# Patient Record
Sex: Female | Born: 1940 | Race: White | Hispanic: No | Smoking: Never smoker
Health system: Southern US, Community
[De-identification: ages and names within clinical notes are randomized; demographics above are authoritative.]

## PROBLEM LIST (undated history)

## (undated) DIAGNOSIS — D709 Neutropenia, unspecified: Secondary | ICD-10-CM

## (undated) DIAGNOSIS — F419 Anxiety disorder, unspecified: Secondary | ICD-10-CM

## (undated) DIAGNOSIS — M1990 Unspecified osteoarthritis, unspecified site: Secondary | ICD-10-CM

## (undated) DIAGNOSIS — G4730 Sleep apnea, unspecified: Secondary | ICD-10-CM

## (undated) DIAGNOSIS — E039 Hypothyroidism, unspecified: Secondary | ICD-10-CM

## (undated) HISTORY — PX: ABDOMINAL HYSTERECTOMY: SHX81

## (undated) HISTORY — PX: APPENDECTOMY: SHX54

## (undated) HISTORY — PX: BREAST SURGERY: SHX581

## (undated) HISTORY — PX: TONSILLECTOMY: SUR1361

## (undated) HISTORY — PX: CHOLECYSTECTOMY: SHX55

---
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
## 2017-08-19 ENCOUNTER — Encounter (INDEPENDENT_AMBULATORY_CARE_PROVIDER_SITE_OTHER): Payer: Self-pay | Admitting: Orthopaedic Surgery

## 2017-08-19 ENCOUNTER — Ambulatory Visit (INDEPENDENT_AMBULATORY_CARE_PROVIDER_SITE_OTHER): Payer: Medicare Other

## 2017-08-19 ENCOUNTER — Ambulatory Visit (INDEPENDENT_AMBULATORY_CARE_PROVIDER_SITE_OTHER): Payer: Medicare Other | Admitting: Orthopaedic Surgery

## 2017-08-19 ENCOUNTER — Ambulatory Visit (INDEPENDENT_AMBULATORY_CARE_PROVIDER_SITE_OTHER): Payer: Self-pay

## 2017-08-19 DIAGNOSIS — M25562 Pain in left knee: Secondary | ICD-10-CM | POA: Diagnosis not present

## 2017-08-19 DIAGNOSIS — M1712 Unilateral primary osteoarthritis, left knee: Secondary | ICD-10-CM | POA: Diagnosis not present

## 2017-08-19 DIAGNOSIS — M25561 Pain in right knee: Secondary | ICD-10-CM | POA: Diagnosis not present

## 2017-08-19 DIAGNOSIS — M1711 Unilateral primary osteoarthritis, right knee: Secondary | ICD-10-CM | POA: Diagnosis not present

## 2017-08-19 NOTE — Progress Notes (Signed)
Office Visit Note   Patient: Bonnie [name]           Date of Birth: Aug 22, 1940           MRN: [id number] Visit Date: 08/19/2017              Requested by: No referring provider defined for this encounter. PCP: Jamal CollinHedgecock, Suzanne, PA-C   Assessment & Plan: Visit Diagnoses:  1. Acute pain of right knee   2. Acute pain of left knee   3. Unilateral primary osteoarthritis, left knee   4. Unilateral primary osteoarthritis, right knee     Plan: At this point she has tried and failed all forms conservative treatment.  His pain is been worsening for several years now.  She has had multiple steroid injections in her knees.  She would like to proceed with a knee replacement surgery on the right knee in the near future and I agree with this.  Since she is going on a trip soon though I would like to place steroid injections in both knees because this is something that she would like to have to go on a mission trip.  I showed her knee replacement model and went over x-rays in detail.  I explained in detail the risks and benefits of surgery.  We talked about her intraoperative and postoperative course.  She tolerated steroid injections in both knees well today.  We will work on scheduling her for a right total knee arthroplasty sometime in late August early September.  All question concerns were answered and addressed.  Again I feel this is appropriate now based on her clinical exam combined with the failure of conservative treatment for well over a year and combined with severity of arthritis on x-rays.  Follow-Up Instructions: Return for 2 weeks post-op.   Orders:  Orders Placed This Encounter  Procedures  . XR KNEE 3 VIEW RIGHT  . XR KNEE 3 VIEW LEFT   No orders of the defined types were placed in this encounter.     Procedures: No procedures performed   Clinical Data: No additional findings.   Subjective: Chief Complaint  Patient presents with  . Right Knee - Pain  The patient  comes in today to establish relationship with me as an orthopedic surgeon because her orthopedic surgeon that she is seen for a long period time is retiring.  She has known severe arthritis in both her knees and her right hurts worse than the left.  She has had multiple interventions for her knees with the last injections being about 3 months ago.  At this point she is tried and failed all forms conservative treatment and wants to be considered for knee replacement surgery.  She is someone who is moderately obese and weighs about 215 pounds she states.  She is not a diabetic and otherwise a healthy individual.  She does not smoke.  She is very active.  She is gotten to where the injections and that helped at all.  They are working on quad strengthening exercises as well.  She is still working on weight loss as well.  She does feel it is time to proceed with knee replacement surgery due to the fact that she is tried conservative treatment for several years now and now is starting to detrimental effect directives daily living, her quality of life, mobility.  Her pain is 10 out of 10 on a daily basis. HPI  Review of Systems She currently denies any headache, chest  pain, shortness of breath, fever, chills, nausea, vomiting.  Objective: Vital Signs: There were no vitals taken for this visit.  Physical Exam She is alert and oriented x3 and in no acute distress Ortho Exam Examination both knees show varus malalignment.  Both knees have significant medial joint line tenderness with patellofemoral crepitation as well.  Both knees have full range of motion and feels ligamentously stable. Specialty Comments:  No specialty comments available.  Imaging: Xr Knee 3 View Left  Result Date: 08/19/2017 X-rays of the left knee show severe end-stage arthritis with varus malalignment.  There is almost complete loss of medial joint space with severe patellofemoral disease.  There are periarticular osteophytes  throughout the knee.  Xr Knee 3 View Right  Result Date: 08/19/2017 2 views of the right knee show end-stage arthritic changes.  There is varus malalignment and complete loss of medial joint space.  There is severe patellofemoral arthritic changes.  There are periarticular osteophytes throughout the knee.    PMFS History: Patient Active Problem List   Diagnosis Date Noted  . Unilateral primary osteoarthritis, right knee 08/19/2017   History reviewed. No pertinent past medical history.  History reviewed. No pertinent family history.  History reviewed. No pertinent surgical history. Social History   Occupational History  . Not on file  Tobacco Use  . Smoking status: Never Smoker  . Smokeless tobacco: Never Used  Substance and Sexual Activity  . Alcohol use: Not on file  . Drug use: Not on file  . Sexual activity: Not on file

## 2017-09-01 ENCOUNTER — Other Ambulatory Visit (INDEPENDENT_AMBULATORY_CARE_PROVIDER_SITE_OTHER): Payer: Self-pay

## 2017-09-08 ENCOUNTER — Telehealth (INDEPENDENT_AMBULATORY_CARE_PROVIDER_SITE_OTHER): Payer: Self-pay | Admitting: Orthopaedic Surgery

## 2017-09-08 NOTE — Telephone Encounter (Signed)
Bonnie [name]

## 2017-09-08 NOTE — Telephone Encounter (Signed)
Patient called advised she lost her last week and will have to go to a rehab facility after surgery. Patient said she [facility] to [facility] [place]. The number to contact patient is [phone]

## 2017-09-09 NOTE — Telephone Encounter (Signed)
I called patient and discussed.

## 2017-09-16 ENCOUNTER — Other Ambulatory Visit (INDEPENDENT_AMBULATORY_CARE_PROVIDER_SITE_OTHER): Payer: Self-pay | Admitting: Physician Assistant

## 2017-09-18 NOTE — Pre-Procedure Instructions (Addendum)
Bonnie MaidensGlendene [name]  09/18/2017      [id number] - [place], [place] - [id number] [place] [id number] Phone: [phone] Fax: [phone]    Your procedure is scheduled on September 30, 2017.  [facility] Admitting at 120 PM.  Call this number if you have problems the morning of surgery:  [phone]   Remember:  Do not eat or drink after midnight.      Take these medicines the morning of surgery with A SIP OF WATER  Alprazolam (xanax) Levothyroxine (synthroid)   7 days prior to surgery STOP taking any Aspirin (unless otherwise instructed by your surgeon), Aleve, Naproxen, Ibuprofen, Motrin, Advil, Goody's, BC's, all herbal medications, fish oil, and all vitamins   Do not wear jewelry, make-up or nail polish.  Do not wear lotions, powders, or perfumes, or deodorant.  Do not shave 48 hours prior to surgery.    Do not bring valuables to the hospital.  [facility] is not responsible for any belongings or valuables.  Contacts, dentures or bridgework may not be worn into surgery.  Leave your suitcase in the car.  After surgery it may be brought to your room.  For patients admitted to the hospital, discharge time will be determined by your treatment team.  Patients discharged the day of surgery will not be allowed to drive home.    [facility]- Preparing For Surgery  Before surgery, you can play an important role. Because skin is not sterile, your skin needs to be as free of germs as possible. You can reduce the number of germs on your skin by washing with CHG (chlorahexidine gluconate) Soap before surgery.  CHG is an antiseptic cleaner which kills germs and bonds with the skin to continue killing germs even after washing.    Oral Hygiene is also important to reduce your risk of infection.  Remember - BRUSH YOUR TEETH THE MORNING OF SURGERY WITH YOUR REGULAR TOOTHPASTE  Please do not use if you have an allergy to CHG or  antibacterial soaps. If your skin becomes reddened/irritated stop using the CHG.  Do not shave (including legs and underarms) for at least 48 hours prior to first CHG shower. It is OK to shave your face.  Please follow these instructions carefully.   1. Shower the NIGHT BEFORE SURGERY and the MORNING OF SURGERY with CHG.   2. If you chose to wash your hair, wash your hair first as usual with your normal shampoo.  3. After you shampoo, rinse your hair and body thoroughly to remove the shampoo.  4. Use CHG as you would any other liquid soap. You can apply CHG directly to the skin and wash gently with a scrungie or a clean washcloth.   5. Apply the CHG Soap to your body ONLY FROM THE NECK DOWN.  Do not use on open wounds or open sores. Avoid contact with your eyes, ears, mouth and genitals (private parts). Wash Face and genitals (private parts)  with your normal soap.  6. Wash thoroughly, paying special attention to the area where your surgery will be performed.  7. Thoroughly rinse your body with warm water from the neck down.  8. DO NOT shower/wash with your normal soap after using and rinsing off the CHG Soap.  9. Pat yourself dry with a CLEAN TOWEL.  10. Wear CLEAN PAJAMAS to bed the night before surgery, wear comfortable clothes the morning of surgery  11. Place  CLEAN SHEETS on your bed the night of your first shower and DO NOT SLEEP WITH PETS.   Day of Surgery:  Do not apply any deodorants/lotions.  Please wear clean clothes to the hospital/surgery center.   Remember to brush your teeth WITH YOUR REGULAR TOOTHPASTE.   Please read over the following fact sheets that you were given.

## 2017-09-19 ENCOUNTER — Encounter (HOSPITAL_COMMUNITY)
Admission: RE | Admit: 2017-09-19 | Discharge: 2017-09-19 | Disposition: A | Discharge status: Home / Self Care | Payer: Medicare Other | Source: Ambulatory Visit | Attending: Orthopaedic Surgery | Admitting: Orthopaedic Surgery

## 2017-09-19 ENCOUNTER — Other Ambulatory Visit: Payer: Self-pay

## 2017-09-19 ENCOUNTER — Encounter (HOSPITAL_COMMUNITY): Payer: Self-pay

## 2017-09-19 DIAGNOSIS — M1711 Unilateral primary osteoarthritis, right knee: Secondary | ICD-10-CM | POA: Insufficient documentation

## 2017-09-19 DIAGNOSIS — Z01818 Encounter for other preprocedural examination: Secondary | ICD-10-CM | POA: Diagnosis present

## 2017-09-19 HISTORY — DX: Sleep apnea, unspecified: G47.30

## 2017-09-19 HISTORY — DX: Neutropenia, unspecified: D70.9

## 2017-09-19 HISTORY — DX: Hypothyroidism, unspecified: E03.9

## 2017-09-19 HISTORY — DX: Anxiety disorder, unspecified: F41.9

## 2017-09-19 HISTORY — DX: Unspecified osteoarthritis, unspecified site: M19.90

## 2017-09-19 LAB — BASIC METABOLIC PANEL
Anion gap: 8 (ref 5–15)
BUN: 18 mg/dL (ref 8–23)
CO2: 24 mmol/L (ref 22–32)
CREATININE: 0.77 mg/dL (ref 0.44–1.00)
Calcium: 9.4 mg/dL (ref 8.9–10.3)
Chloride: 110 mmol/L (ref 98–111)
GFR calc Af Amer: >60 mL/min (ref >60)
Glucose, Bld: 99 mg/dL (ref 70–99)
Potassium: 4.9 mmol/L (ref 3.5–5.1)
SODIUM: 142 mmol/L (ref 135–145)

## 2017-09-19 LAB — CBC
HCT: 42.5 % (ref 36.0–46.0)
Hemoglobin: 13.3 g/dL (ref 12.0–15.0)
MCH: 28.9 pg (ref 26.0–34.0)
MCHC: 31.3 g/dL (ref 30.0–36.0)
MCV: 92.4 fL (ref 78.0–100.0)
PLATELETS: 170 10*3/uL (ref 150–400)
RBC: 4.6 MIL/uL (ref 3.87–5.11)
RDW: 13.4 % (ref 11.5–15.5)
WBC: 3.5 10*3/uL — AB (ref 4.0–10.5)

## 2017-09-19 LAB — SURGICAL PCR SCREEN
MRSA, PCR: NEGATIVE
Staphylococcus aureus: NEGATIVE

## 2017-09-19 NOTE — Progress Notes (Addendum)
PCP: Jamal CollinSuzanne Hedgecock, PA-C  Cardiologist:  Pt denies  EKG: pt denies past year  Stress test: pt denies  ECHO: pt denies  Cardiac Cath: pt denies  Chest x-ray: pt denies past year, no recent respiratory infections/complications

## 2017-09-29 MED ORDER — TRANEXAMIC ACID 1000 MG/10ML IV SOLN
1000.0000 mg | INTRAVENOUS | Status: AC
  Administered 2017-09-30: 1000 mg via INTRAVENOUS
  Filled 2017-09-29: qty 1000

## 2017-09-29 MED ORDER — CEFAZOLIN SODIUM-DEXTROSE 2-4 GM/100ML-% IV SOLN
2.0000 g | INTRAVENOUS | Status: AC
  Administered 2017-09-30: 2 g via INTRAVENOUS
  Filled 2017-09-29: qty 100

## 2017-09-30 ENCOUNTER — Inpatient Hospital Stay (HOSPITAL_COMMUNITY)
Admission: RE | Admit: 2017-09-30 | Discharge: 2017-10-03 | DRG: 470 | Disposition: A | Discharge status: Skilled Nursing Facility | Payer: Medicare Other | Source: Ambulatory Visit | Attending: Orthopaedic Surgery | Admitting: Orthopaedic Surgery

## 2017-09-30 ENCOUNTER — Ambulatory Visit (HOSPITAL_COMMUNITY): Payer: Medicare Other | Admitting: Anesthesiology

## 2017-09-30 ENCOUNTER — Encounter (HOSPITAL_COMMUNITY)
Admission: RE | Disposition: A | Discharge status: Skilled Nursing Facility | Payer: Self-pay | Source: Ambulatory Visit | Attending: Orthopaedic Surgery

## 2017-09-30 ENCOUNTER — Encounter (HOSPITAL_COMMUNITY): Payer: Self-pay | Admitting: *Deleted

## 2017-09-30 ENCOUNTER — Inpatient Hospital Stay (HOSPITAL_COMMUNITY): Payer: Medicare Other

## 2017-09-30 DIAGNOSIS — Z90710 Acquired absence of both cervix and uterus: Secondary | ICD-10-CM

## 2017-09-30 DIAGNOSIS — E039 Hypothyroidism, unspecified: Secondary | ICD-10-CM | POA: Diagnosis present

## 2017-09-30 DIAGNOSIS — Z791 Long term (current) use of non-steroidal anti-inflammatories (NSAID): Secondary | ICD-10-CM

## 2017-09-30 DIAGNOSIS — Z888 Allergy status to other drugs, medicaments and biological substances status: Secondary | ICD-10-CM | POA: Diagnosis not present

## 2017-09-30 DIAGNOSIS — Z9049 Acquired absence of other specified parts of digestive tract: Secondary | ICD-10-CM | POA: Diagnosis not present

## 2017-09-30 DIAGNOSIS — Z96651 Presence of right artificial knee joint: Secondary | ICD-10-CM

## 2017-09-30 DIAGNOSIS — Z79890 Hormone replacement therapy (postmenopausal): Secondary | ICD-10-CM | POA: Diagnosis not present

## 2017-09-30 DIAGNOSIS — M1711 Unilateral primary osteoarthritis, right knee: Principal | ICD-10-CM | POA: Diagnosis present

## 2017-09-30 DIAGNOSIS — F419 Anxiety disorder, unspecified: Secondary | ICD-10-CM | POA: Diagnosis present

## 2017-09-30 DIAGNOSIS — G4730 Sleep apnea, unspecified: Secondary | ICD-10-CM | POA: Diagnosis present

## 2017-09-30 DIAGNOSIS — M25561 Pain in right knee: Secondary | ICD-10-CM | POA: Diagnosis present

## 2017-09-30 HISTORY — PX: TOTAL KNEE ARTHROPLASTY: SHX125

## 2017-09-30 SURGERY — ARTHROPLASTY, KNEE, TOTAL
Anesthesia: Monitor Anesthesia Care | Site: Knee | Laterality: Right

## 2017-09-30 MED ORDER — PROPOFOL 10 MG/ML IV BOLUS
INTRAVENOUS | Status: DC | PRN
  Administered 2017-09-30: 20 mg via INTRAVENOUS

## 2017-09-30 MED ORDER — MIDAZOLAM HCL 2 MG/2ML IJ SOLN
INTRAMUSCULAR | Status: AC
  Filled 2017-09-30: qty 2

## 2017-09-30 MED ORDER — ONDANSETRON HCL 4 MG/2ML IJ SOLN
INTRAMUSCULAR | Status: AC
  Filled 2017-09-30: qty 6

## 2017-09-30 MED ORDER — METHOCARBAMOL 1000 MG/10ML IJ SOLN
500.0000 mg | Freq: Four times a day (QID) | INTRAVENOUS | Status: DC | PRN
  Filled 2017-09-30: qty 5

## 2017-09-30 MED ORDER — ASPIRIN EC 325 MG PO TBEC
325.0000 mg | DELAYED_RELEASE_TABLET | Freq: Two times a day (BID) | ORAL | Status: DC
  Administered 2017-10-01 – 2017-10-03 (×5): 325 mg via ORAL
  Filled 2017-09-30 (×5): qty 1

## 2017-09-30 MED ORDER — METOCLOPRAMIDE HCL 5 MG PO TABS: 5.0000 mg | ORAL_TABLET | Freq: Three times a day (TID) | ORAL | Status: DC | PRN

## 2017-09-30 MED ORDER — DOCUSATE SODIUM 100 MG PO CAPS
100.0000 mg | ORAL_CAPSULE | Freq: Two times a day (BID) | ORAL | Status: DC
  Administered 2017-09-30 – 2017-10-03 (×6): 100 mg via ORAL
  Filled 2017-09-30 (×6): qty 1

## 2017-09-30 MED ORDER — DEXAMETHASONE SODIUM PHOSPHATE 10 MG/ML IJ SOLN
INTRAMUSCULAR | Status: AC
  Filled 2017-09-30: qty 2

## 2017-09-30 MED ORDER — METHOCARBAMOL 500 MG PO TABS
500.0000 mg | ORAL_TABLET | Freq: Four times a day (QID) | ORAL | Status: DC | PRN
  Administered 2017-09-30 – 2017-10-03 (×6): 500 mg via ORAL
  Filled 2017-09-30 (×7): qty 1

## 2017-09-30 MED ORDER — POLYETHYLENE GLYCOL 3350 17 G PO PACK
17.0000 g | PACK | Freq: Every day | ORAL | Status: DC | PRN
  Administered 2017-10-02: 17 g via ORAL
  Filled 2017-09-30: qty 1

## 2017-09-30 MED ORDER — ACETAMINOPHEN 325 MG PO TABS: 325.0000 mg | ORAL_TABLET | Freq: Four times a day (QID) | ORAL | Status: DC | PRN

## 2017-09-30 MED ORDER — SODIUM CHLORIDE 0.9 % IV SOLN
INTRAVENOUS | Status: DC | PRN
  Administered 2017-09-30: 30 ug/min via INTRAVENOUS

## 2017-09-30 MED ORDER — BUPIVACAINE IN DEXTROSE 0.75-8.25 % IT SOLN
INTRATHECAL | Status: DC | PRN
  Administered 2017-09-30: 2 mL via INTRATHECAL

## 2017-09-30 MED ORDER — MENTHOL 3 MG MT LOZG: 1.0000 | LOZENGE | OROMUCOSAL | Status: DC | PRN

## 2017-09-30 MED ORDER — ALUM & MAG HYDROXIDE-SIMETH 200-200-20 MG/5ML PO SUSP: 30.0000 mL | ORAL | Status: DC | PRN

## 2017-09-30 MED ORDER — ONDANSETRON HCL 4 MG/2ML IJ SOLN: 4.0000 mg | Freq: Once | INTRAMUSCULAR | Status: DC | PRN

## 2017-09-30 MED ORDER — OXYCODONE HCL 5 MG PO TABS: 10.0000 mg | ORAL_TABLET | ORAL | Status: DC | PRN

## 2017-09-30 MED ORDER — ONDANSETRON HCL 4 MG PO TABS: 4.0000 mg | ORAL_TABLET | Freq: Four times a day (QID) | ORAL | Status: DC | PRN

## 2017-09-30 MED ORDER — FENTANYL CITRATE (PF) 250 MCG/5ML IJ SOLN
INTRAMUSCULAR | Status: AC
  Filled 2017-09-30: qty 5

## 2017-09-30 MED ORDER — HYDROMORPHONE HCL 1 MG/ML IJ SOLN
0.5000 mg | INTRAMUSCULAR | Status: DC | PRN
  Filled 2017-09-30: qty 1

## 2017-09-30 MED ORDER — CHLORHEXIDINE GLUCONATE 4 % EX LIQD: 60.0000 mL | Freq: Once | CUTANEOUS | Status: DC

## 2017-09-30 MED ORDER — MEPERIDINE HCL 50 MG/ML IJ SOLN: 6.2500 mg | INTRAMUSCULAR | Status: DC | PRN

## 2017-09-30 MED ORDER — OXYCODONE HCL 5 MG PO TABS
5.0000 mg | ORAL_TABLET | ORAL | Status: DC | PRN
  Administered 2017-09-30 – 2017-10-01 (×4): 5 mg via ORAL
  Administered 2017-10-01: 10 mg via ORAL
  Administered 2017-10-01 – 2017-10-03 (×6): 5 mg via ORAL
  Filled 2017-09-30 (×5): qty 1
  Filled 2017-09-30: qty 2
  Filled 2017-09-30 (×5): qty 1

## 2017-09-30 MED ORDER — ALPRAZOLAM 0.25 MG PO TABS: 0.2500 mg | ORAL_TABLET | Freq: Every day | ORAL | Status: DC | PRN

## 2017-09-30 MED ORDER — LEVOTHYROXINE SODIUM 75 MCG PO TABS
150.0000 ug | ORAL_TABLET | Freq: Every day | ORAL | Status: DC
  Administered 2017-10-01 – 2017-10-03 (×3): 150 ug via ORAL
  Filled 2017-09-30 (×3): qty 2

## 2017-09-30 MED ORDER — FENTANYL CITRATE (PF) 100 MCG/2ML IJ SOLN
INTRAMUSCULAR | Status: AC
  Filled 2017-09-30: qty 2

## 2017-09-30 MED ORDER — PANTOPRAZOLE SODIUM 40 MG PO TBEC
40.0000 mg | DELAYED_RELEASE_TABLET | Freq: Every day | ORAL | Status: DC
  Administered 2017-10-01 – 2017-10-03 (×3): 40 mg via ORAL
  Filled 2017-09-30 (×3): qty 1

## 2017-09-30 MED ORDER — LACTATED RINGERS IV SOLN
INTRAVENOUS | Status: DC
  Administered 2017-09-30: 13:00:00 via INTRAVENOUS

## 2017-09-30 MED ORDER — PHENOL 1.4 % MT LIQD: 1.0000 | OROMUCOSAL | Status: DC | PRN

## 2017-09-30 MED ORDER — HYDROMORPHONE HCL 1 MG/ML IJ SOLN: 0.2500 mg | INTRAMUSCULAR | Status: DC | PRN

## 2017-09-30 MED ORDER — SODIUM CHLORIDE 0.9 % IR SOLN
Status: DC | PRN
  Administered 2017-09-30: 3000 mL

## 2017-09-30 MED ORDER — PROPOFOL 500 MG/50ML IV EMUL
INTRAVENOUS | Status: DC | PRN
  Administered 2017-09-30: 50 ug/kg/min via INTRAVENOUS

## 2017-09-30 MED ORDER — 0.9 % SODIUM CHLORIDE (POUR BTL) OPTIME
TOPICAL | Status: DC | PRN
  Administered 2017-09-30: 1000 mL

## 2017-09-30 MED ORDER — ONDANSETRON HCL 4 MG/2ML IJ SOLN
INTRAMUSCULAR | Status: DC | PRN
  Administered 2017-09-30: 4 mg via INTRAVENOUS

## 2017-09-30 MED ORDER — CEFAZOLIN SODIUM-DEXTROSE 1-4 GM/50ML-% IV SOLN
1.0000 g | Freq: Four times a day (QID) | INTRAVENOUS | Status: AC
  Administered 2017-09-30 – 2017-10-01 (×2): 1 g via INTRAVENOUS
  Filled 2017-09-30 (×2): qty 50

## 2017-09-30 MED ORDER — SODIUM CHLORIDE 0.9 % IV SOLN
INTRAVENOUS | Status: DC
  Administered 2017-09-30: 19:00:00 via INTRAVENOUS

## 2017-09-30 MED ORDER — METOCLOPRAMIDE HCL 5 MG/ML IJ SOLN: 5.0000 mg | Freq: Three times a day (TID) | INTRAMUSCULAR | Status: DC | PRN

## 2017-09-30 MED ORDER — LACTATED RINGERS IV SOLN
INTRAVENOUS | Status: DC | PRN
  Administered 2017-09-30: 13:00:00 via INTRAVENOUS

## 2017-09-30 MED ORDER — PHENYLEPHRINE HCL 10 MG/ML IJ SOLN
INTRAMUSCULAR | Status: AC
  Filled 2017-09-30: qty 1

## 2017-09-30 MED ORDER — FENTANYL CITRATE (PF) 100 MCG/2ML IJ SOLN
INTRAMUSCULAR | Status: DC | PRN
  Administered 2017-09-30: 50 ug via INTRAVENOUS

## 2017-09-30 MED ORDER — MIDAZOLAM HCL 5 MG/5ML IJ SOLN
INTRAMUSCULAR | Status: DC | PRN
  Administered 2017-09-30: 2 mg via INTRAVENOUS

## 2017-09-30 MED ORDER — DIPHENHYDRAMINE HCL 12.5 MG/5ML PO ELIX: 12.5000 mg | ORAL_SOLUTION | ORAL | Status: DC | PRN

## 2017-09-30 MED ORDER — ONDANSETRON HCL 4 MG/2ML IJ SOLN: 4.0000 mg | Freq: Four times a day (QID) | INTRAMUSCULAR | Status: DC | PRN

## 2017-09-30 SURGICAL SUPPLY — 66 items
BANDAGE ACE 6X5 VEL STRL LF (GAUZE/BANDAGES/DRESSINGS) ×4 IMPLANT
BANDAGE ESMARK 6X9 LF (GAUZE/BANDAGES/DRESSINGS) ×1 IMPLANT
BEARIN INSERT TIBIAL 3X13 (Insert) ×2 IMPLANT
BEARING INSERT TIBIAL 3X13 (Insert) ×1 IMPLANT
BLADE SAG 18X100X1.27 (BLADE) ×2 IMPLANT
BNDG ESMARK 6X9 LF (GAUZE/BANDAGES/DRESSINGS) ×2
BOWL SMART MIX CTS (DISPOSABLE) ×2 IMPLANT
COVER SURGICAL LIGHT HANDLE (MISCELLANEOUS) ×2 IMPLANT
CUFF TOURNIQUET SINGLE 34IN LL (TOURNIQUET CUFF) ×2 IMPLANT
CUFF TOURNIQUET SINGLE 44IN (TOURNIQUET CUFF) IMPLANT
DRAPE EXTREMITY T 121X128X90 (DRAPE) ×2 IMPLANT
DRAPE HALF SHEET 40X57 (DRAPES) ×2 IMPLANT
DRAPE U-SHAPE 47X51 STRL (DRAPES) ×2 IMPLANT
DRSG PAD ABDOMINAL 8X10 ST (GAUZE/BANDAGES/DRESSINGS) ×4 IMPLANT
DURAPREP 26ML APPLICATOR (WOUND CARE) ×2 IMPLANT
ELECT CAUTERY BLADE 6.4 (BLADE) ×2 IMPLANT
ELECT REM PT RETURN 9FT ADLT (ELECTROSURGICAL) ×2
ELECTRODE REM PT RTRN 9FT ADLT (ELECTROSURGICAL) ×1 IMPLANT
FACESHIELD WRAPAROUND (MASK) ×4 IMPLANT
FEMORAL POSTERIOR SZ3 RT (Joint) ×1 IMPLANT
GAUZE SPONGE 4X4 12PLY STRL (GAUZE/BANDAGES/DRESSINGS) ×2 IMPLANT
GAUZE SPONGE 4X4 12PLY STRL LF (GAUZE/BANDAGES/DRESSINGS) ×2 IMPLANT
GAUZE XEROFORM 1X8 LF (GAUZE/BANDAGES/DRESSINGS) ×2 IMPLANT
GAUZE XEROFORM 5X9 LF (GAUZE/BANDAGES/DRESSINGS) ×2 IMPLANT
GLOVE BIOGEL PI IND STRL 8 (GLOVE) ×2 IMPLANT
GLOVE BIOGEL PI INDICATOR 8 (GLOVE) ×2
GLOVE ORTHO TXT STRL SZ7.5 (GLOVE) ×2 IMPLANT
GLOVE SURG ORTHO 8.0 STRL STRW (GLOVE) ×2 IMPLANT
GOWN STRL REUS W/ TWL LRG LVL3 (GOWN DISPOSABLE) IMPLANT
GOWN STRL REUS W/ TWL XL LVL3 (GOWN DISPOSABLE) ×2 IMPLANT
GOWN STRL REUS W/TWL LRG LVL3 (GOWN DISPOSABLE)
GOWN STRL REUS W/TWL XL LVL3 (GOWN DISPOSABLE) ×2
HANDPIECE INTERPULSE COAX TIP (DISPOSABLE) ×1
IMMOBILIZER KNEE 22 (SOFTGOODS) ×2 IMPLANT
IMMOBILIZER KNEE 22 UNIV (SOFTGOODS) ×2 IMPLANT
KIT BASIN OR (CUSTOM PROCEDURE TRAY) ×2 IMPLANT
KIT TURNOVER KIT B (KITS) ×2 IMPLANT
KNEE PATELLA ASYMMETRIC 9X29 (Knees) ×2 IMPLANT
KNEE TIBIAL COMPONENT SZ3 (Knees) ×2 IMPLANT
MANIFOLD NEPTUNE II (INSTRUMENTS) ×2 IMPLANT
NDL SAFETY ECLIPSE 18X1.5 (NEEDLE) IMPLANT
NEEDLE HYPO 18GX1.5 SHARP (NEEDLE)
NS IRRIG 1000ML POUR BTL (IV SOLUTION) ×2 IMPLANT
PACK TOTAL JOINT (CUSTOM PROCEDURE TRAY) ×2 IMPLANT
PAD ABD 8X10 STRL (GAUZE/BANDAGES/DRESSINGS) ×2 IMPLANT
PAD ARMBOARD 7.5X6 YLW CONV (MISCELLANEOUS) ×2 IMPLANT
PADDING CAST COTTON 6X4 STRL (CAST SUPPLIES) ×2 IMPLANT
POSTERIOR FEMORAL SZ3 RT (Joint) ×2 IMPLANT
SET HNDPC FAN SPRY TIP SCT (DISPOSABLE) ×1 IMPLANT
SET PAD KNEE POSITIONER (MISCELLANEOUS) ×2 IMPLANT
STAPLER VISISTAT 35W (STAPLE) IMPLANT
STRIP CLOSURE SKIN 1/2X4 (GAUZE/BANDAGES/DRESSINGS) IMPLANT
SUCTION FRAZIER HANDLE 10FR (MISCELLANEOUS) ×1
SUCTION TUBE FRAZIER 10FR DISP (MISCELLANEOUS) ×1 IMPLANT
SUT MNCRL AB 4-0 PS2 18 (SUTURE) IMPLANT
SUT VIC AB 0 CT1 27 (SUTURE) ×1
SUT VIC AB 0 CT1 27XBRD ANBCTR (SUTURE) ×1 IMPLANT
SUT VIC AB 1 CT1 27 (SUTURE) ×2
SUT VIC AB 1 CT1 27XBRD ANBCTR (SUTURE) ×2 IMPLANT
SUT VIC AB 2-0 CT1 27 (SUTURE) ×2
SUT VIC AB 2-0 CT1 TAPERPNT 27 (SUTURE) ×2 IMPLANT
SYR 50ML LL SCALE MARK (SYRINGE) IMPLANT
TOWEL OR 17X24 6PK STRL BLUE (TOWEL DISPOSABLE) ×2 IMPLANT
TOWEL OR 17X26 10 PK STRL BLUE (TOWEL DISPOSABLE) ×2 IMPLANT
TRAY CATH 16FR W/PLASTIC CATH (SET/KITS/TRAYS/PACK) ×2 IMPLANT
WRAP KNEE MAXI GEL POST OP (GAUZE/BANDAGES/DRESSINGS) ×2 IMPLANT

## 2017-09-30 NOTE — Anesthesia Procedure Notes (Signed)
Anesthesia Regional Block: Adductor canal block   Pre-Anesthetic Checklist: ,, timeout performed, Correct Patient, Correct Site, Correct Laterality, Correct Procedure, Correct Position, site marked, Risks and benefits discussed,  Surgical consent,  Pre-op evaluation,  At surgeon's request and post-op pain management  Laterality: Right  Prep: chloraprep       Needles:  Injection technique: Single-shot  Needle Type: Echogenic Stimulator Needle     Needle Length: 9cm  Needle Gauge: 21     Additional Needles:   Narrative:  Start time: 09/30/2017 1:15 PM End time: 09/30/2017 1:25 PM Injection made incrementally with aspirations every 5 mL.  Performed by: Personally  Anesthesiologist: Arta Brucessey, Adamae Ricklefs, MD  Additional Notes: Monitors applied. Patient sedated. Sterile prep and drape,hand hygiene and sterile gloves were used. Relevant anatomy identified.Needle position confirmed.Local anesthetic injected incrementally after negative aspiration. Local anesthetic spread visualized around nerve(s). Vascular puncture avoided. No complications. Image printed for medical record.The patient tolerated the procedure well.    Arta BruceKevin Zariah Jost MD

## 2017-09-30 NOTE — H&P (Signed)
TOTAL KNEE ADMISSION H&P  Patient is being admitted for right total knee arthroplasty.  Subjective:  Chief Complaint:right knee pain.  HPI: Bonnie [name], 77 y.o. female, has a history of pain and functional disability in the right knee due to arthritis and has failed non-surgical conservative treatments for greater than 12 weeks to includeNSAID's and/or analgesics, corticosteriod injections, viscosupplementation injections, flexibility and strengthening excercises, use of assistive devices, weight reduction as appropriate and activity modification.  Onset of symptoms was gradual, starting 3 years ago with gradually worsening course since that time. The patient noted no past surgery on the right knee(s).  Patient currently rates pain in the right knee(s) at 10 out of 10 with activity. Patient has decreased mobility, night pain, crepitation and a significant limp..  Patient has evidence of subchondral sclerosis, periarticular osteophytes and joint space narrowing by imaging studies. There is no active infection.  Patient Active Problem List   Diagnosis Date Noted  . Unilateral primary osteoarthritis, right knee 08/19/2017   Past Medical History:  Diagnosis Date  . Anxiety   . Arthritis   . Hypothyroidism   . Neutropenia (HCC)    had this in the past, resolve in 2014  . Sleep apnea    "mild"-does not use cpap    Past Surgical History:  Procedure Laterality Date  . ABDOMINAL HYSTERECTOMY    . CHOLECYSTECTOMY      Current Facility-Administered Medications  Medication Dose Route Frequency Provider Last Rate Last Dose  . 0.9 % irrigation (POUR BTL)    PRN Kathryne HitchBlackman, Sumedh Shinsato Y, MD   1,000 mL at 09/30/17 1338  . ceFAZolin (ANCEF) IVPB 2g/100 mL premix  2 g Intravenous To SS-Surg Kathryne HitchBlackman, Amay Mijangos Y, MD      . chlorhexidine (HIBICLENS) 4 % liquid 4 application  60 mL Topical Once Richardean Canallark, Gilbert W, PA-C      . lactated ringers infusion   Intravenous Continuous Trevor IhaHouser, Stephen A, MD 50  mL/hr at 09/30/17 1256    . sodium chloride irrigation 0.9 %    PRN Kathryne HitchBlackman, [place] Pettengill Y, MD   3,000 mL at 09/30/17 1338  . tranexamic acid (CYKLOKAPRON) 1,000 mg in sodium chloride 0.9 % 100 mL IVPB  1,000 mg Intravenous To OR Kathryne HitchBlackman, Lorriann Hansmann Y, MD       Facility-Administered Medications Ordered in Other Encounters  Medication Dose Route Frequency Provider Last Rate Last Dose  . fentaNYL (SUBLIMAZE) injection    Anesthesia Intra-op Carmela RimaMartinelli, John F, CRNA   50 mcg at 09/30/17 1334  . lactated ringers infusion    Continuous PRN Carmela RimaMartinelli, John F, CRNA      . midazolam (VERSED) 5 MG/5ML injection    Anesthesia Intra-op Carmela RimaMartinelli, John F, CRNA   2 mg at 09/30/17 1334   Allergies  Allergen Reactions  . Metoclopramide Rash    Social History   Tobacco Use  . Smoking status: Never Smoker  . Smokeless tobacco: Never Used  Substance Use Topics  . Alcohol use: Not Currently    History reviewed. No pertinent family history.   Review of Systems  Musculoskeletal: Positive for joint pain.  All other systems reviewed and are negative.   Objective:  Physical Exam  Constitutional: She is oriented to person, place, and time. She appears well-developed and well-nourished.  HENT:  Head: Normocephalic and atraumatic.  Eyes: Pupils are equal, round, and reactive to light. EOM are normal.  Neck: Normal range of motion. Neck supple.  Cardiovascular: Normal rate.  Respiratory: Effort normal and breath sounds  normal.  GI: Soft. Bowel sounds are normal.  Musculoskeletal:       Right knee: She exhibits decreased range of motion, swelling, effusion, abnormal alignment and abnormal meniscus. Tenderness found. Medial joint line and lateral joint line tenderness noted.  Neurological: She is alert and oriented to person, place, and time.  Skin: Skin is warm and dry.  Psychiatric: She has a normal mood and affect.    Vital signs in last 24 hours: Temp:  [98.1 F (36.7 C)] 98.1 F (36.7  C) (08/27 1239) Pulse Rate:  [66] 66 (08/27 1239) Resp:  [18] 18 (08/27 1239) BP: (130)/(84) 130/84 (08/27 1239) SpO2:  [100 %] 100 % (08/27 1239)  Labs:   Estimated body mass index is 38.21 kg/m as calculated from the following:   Height as of 09/19/17: 5\' 2"  (1.575 m).   Weight as of 09/19/17: 94.8 kg.   Imaging Review Plain radiographs demonstrate severe degenerative joint disease of the right knee(s). The overall alignment ismild varus. The bone quality appears to be good for age and reported activity level.   Preoperative templating of the joint replacement has been completed, documented, and submitted to the Operating Room personnel in order to optimize intra-operative equipment management.   Anticipated LOS equal to or greater than 2 midnights due to - Age 45 and older with one or more of the following:  - Obesity  - Expected need for hospital services (PT, OT, Nursing) required for safe  discharge  - Anticipated need for postoperative skilled nursing care or inpatient rehab  - Active co-morbidities: None OR   - Unanticipated findings during/Post Surgery: None  - Patient is a high risk of re-admission due to: None     Assessment/Plan:  End stage arthritis, right knee   The patient history, physical examination, clinical judgment of the provider and imaging studies are consistent with end stage degenerative joint disease of the right knee(s) and total knee arthroplasty is deemed medically necessary. The treatment options including medical management, injection therapy arthroscopy and arthroplasty were discussed at length. The risks and benefits of total knee arthroplasty were presented and reviewed. The risks due to aseptic loosening, infection, stiffness, patella tracking problems, thromboembolic complications and other imponderables were discussed. The patient acknowledged the explanation, agreed to proceed with the plan and consent was signed. Patient is being admitted  for inpatient treatment for surgery, pain control, PT, OT, prophylactic antibiotics, VTE prophylaxis, progressive ambulation and ADL's and discharge planning. The patient is planning to be discharged to skilled nursing facility

## 2017-09-30 NOTE — Op Note (Signed)
NAMESAMEENA, ARTUS MEDICAL RECORD ZO:[id number] ACCOUNT [id number] DATE OF BIRTH:1940/05/06 FACILITY: [facility] LOCATION: [facility] PHYSICIAN:Izzy Courville Aretha Parrot, MD  OPERATIVE REPORT  DATE OF PROCEDURE:  09/30/2017  PREOPERATIVE DIAGNOSIS:  Primary osteoarthritis and degenerative joint disease, right knee.  POSTOPERATIVE DIAGNOSIS:  Primary osteoarthritis and degenerative joint disease, right knee.  PROCEDURE:  Right total knee arthroplasty.  IMPLANTS:  Stryker Triathlon press-fit knee system with size 3 femur, size 3 tibial tray, fixed-bearing 13 mm polyethylene insert, size 29 press-fit patellar button.  SURGEON:  Vanita Panda. Magnus Ivan, MD  ASSISTANT:  Richardean Canal, PA-C  ANESTHESIA: 1.  Right lower extremity adductor canal block. 2.  Spinal.  TOURNIQUET TIME:  Less than 1 hour.  ANTIBIOTICS:  Two grams IV Ancef.  ESTIMATED BLOOD LOSS:  Less than 100 mL.  COMPLICATIONS:  None.  INDICATIONS:  The patient is a very pleasant 77 year old active female with bilateral knee degenerative joint disease and osteoarthritis.  Her right knee hurts her significantly worse.  She has tried and failed all forms of conservative treatment and  does wish to proceed with a total knee arthroplasty.  Unfortunately, her husband passed away recently.  She is very somber from this, but still wants to proceed with surgery given the severity of her pain, her decreased mobility, and the detrimental  impact this has had on her activities of daily living and her quality of life.  At this point, she understands fully the risk of acute blood loss anemia, nerve and vessel injury, fracture, infection, DVT and implant failure.  She understands her goals  are to decrease pain, improve mobility and overall improve quality of life.  DESCRIPTION OF PROCEDURE:  After informed consent was obtained, appropriate right knee was marked.  An adductor canal block was obtained in the holding room.  She was brought to  the operating room and sat up on the operating table where spinal anesthesia  was obtained.  She was then laid in the supine position.  A Foley catheter was placed, and a nonsterile tourniquet was placed around her upper right thigh.  Her right thigh, knee, leg, ankle and foot were prepped and draped with DuraPrep and sterile  drapes.  A time-out was called, and she was identified as correct patient, correct right knee.  We then used an Esmarch to wrap that leg, and tourniquet was inflated to 300 mm of pressure.  We made a direct midline incision over the patella and carried  this proximally and distally.  We dissected down the knee joint and carried out a medial parapatellar arthrotomy, finding a large joint effusion and significant arthritis throughout her knee.  With the knee in a flexed position, we removed remnants of  ACL, PCL, medial and lateral meniscus and osteophytes around the knee.  We set her extramedullary cutting guide for taking 2 mm off the high side, correcting varus and valgus in a neutral slope for making our proximal tibia cut.  We made this cut without  difficulty, but we felt like we needed to take a little more tibia, so we took 2 more millimeters.  We then used an intramedullary guide for the femur, setting our distal femoral cutting block for an 8 mm distal femoral cut based on her right knee for 5  degrees externally rotated.  We made this cut without difficulty and brought the knee back down in full extension with a 9 mm extension block.  She actually hyperextended.  We then went back to the femur and put our femoral  sizing guide based off the  epicondylar axis and chose a size 3 femur.  We put a 4-in-1 cutting block for a size 3 femur, made our anterior and posterior cuts followed by our chamfer cuts.  We then made our femoral box cut.  Attention was then turned back to the tibia, and we  placed our tibial tray on the tibial surface, choosing a size 3, setting the rotation off  the tibial tubercle and the femur.  We chose a size 3 tibia tray.  We made our keel punch off of this for a press-fit tibia and then placed our trial size 3 tibia  and a 3 femur and trialed a 13 mm fixed-bearing polyethylene insert, which was a trial insert, and we were pleased with stability and range of motion.  We then made our patellar cut and drilled 3 holes for a size 29 press-fit patellar button.  We then  removed all trial instrumentation from the knee and irrigated the knee with normal saline solution using pulsatile lavage.  We then placed our real implants with the size 3 Stryker press-fit tibial tray followed by the size 3 right press-fit femur.  We  placed our real fixed-bearing 13 mm polyethylene insert and press-fit our size 29 patellar button.  We then let the tourniquet down.  Hemostasis was obtained with electrocautery.  I was pleased with the range of motion and stability of the knee.  We then  irrigated the knee with normal saline solution and closed our arthrotomy with interrupted #1 Vicryl suture, followed by 0 Vicryl in the deep tissue, 2-0 Vicryl subcutaneous tissue and interrupted staples on the skin.  Xeroform and a well-padded sterile  dressing were applied.  She was taken to the recovery room in stable condition.  All final counts were correct.  There were no complications noted.  Note Rexene EdisonGil Clark, PA-C, assisted with the entire case.  His assistance was crucial for facilitating all  aspects of this case.  LN/NUANCE  D:09/30/2017 T:09/30/2017 JOB:[id number]

## 2017-09-30 NOTE — Anesthesia Procedure Notes (Signed)
Procedure Name: MAC Date/Time: 09/30/2017 2:08 PM Performed by: Neldon Newport, CRNA Pre-anesthesia Checklist: Timeout performed, Patient being monitored, Suction available, Emergency Drugs available and Patient identified Patient Re-evaluated:Patient Re-evaluated prior to induction Oxygen Delivery Method: Simple face mask Placement Confirmation: positive ETCO2 Dental Injury: Teeth and Oropharynx as per pre-operative assessment

## 2017-09-30 NOTE — Progress Notes (Signed)
Orthopedic Tech Progress Note Patient Details:  Bonnie MaidensGlendene [name] [birth year] [id number]  CPM Right Knee CPM Right Knee: On Right Knee Flexion (Degrees): 90 Right Knee Extension (Degrees): 0 Pt's current bed will not accept trapeze bar patient helper;RN  notified Post Interventions Patient Tolerated: Well Instructions Provided: Care of device  Nikki DomCrawford, Shayne Diguglielmo 09/30/2017, 4:43 PM

## 2017-09-30 NOTE — Brief Op Note (Signed)
09/30/2017  3:31 PM  PATIENT:  Bonnie [name]  77 y.o. female  PRE-OPERATIVE DIAGNOSIS:  osteoarthritis right knee  POST-OPERATIVE DIAGNOSIS:  osteoarthritis right knee  PROCEDURE:  Procedure(s): RIGHT TOTAL KNEE ARTHROPLASTY (Right)  SURGEON:  Surgeon(s) and Role:    Kathryne Hitch* Kayliana Codd Y, MD - Primary  PHYSICIAN ASSISTANT: Rexene EdisonGil Clark, PA-C  ANESTHESIA:   regional and spinal  EBL:  25 mL   COUNTS:  YES  TOURNIQUET:   52 minutes  DICTATION: .Other Dictation: Dictation Number [id number]  PLAN OF CARE: Admit to inpatient   PATIENT DISPOSITION:  PACU - hemodynamically stable.   Delay start of Pharmacological VTE agent (>24hrs) due to surgical blood loss or risk of bleeding: no

## 2017-09-30 NOTE — Anesthesia Preprocedure Evaluation (Addendum)
Anesthesia Evaluation  Patient identified by MRN, date of birth, ID band Patient awake    Reviewed: Allergy & Precautions, H&P , NPO status , Patient's Chart, lab work & pertinent test results  Airway Mallampati: II  TM Distance: >3 FB Neck ROM: full    Dental  (+) Teeth Intact, Dental Advidsory Given   Pulmonary sleep apnea ,    Pulmonary exam normal        Cardiovascular Normal cardiovascular exam     Neuro/Psych Anxiety    GI/Hepatic   Endo/Other  Hypothyroidism   Renal/GU      Musculoskeletal   Abdominal   Peds  Hematology   Anesthesia Other Findings   Reproductive/Obstetrics                            Anesthesia Physical Anesthesia Plan  ASA: III  Anesthesia Plan: Spinal   Post-op Pain Management:  Regional for Post-op pain   Induction: Intravenous  PONV Risk Score and Plan: 2 and Ondansetron and Midazolam  Airway Management Planned: Simple Face Mask  Additional Equipment:   Intra-op Plan:   Post-operative Plan:   Informed Consent: I have reviewed the patients History and Physical, chart, labs and discussed the procedure including the risks, benefits and alternatives for the proposed anesthesia with the patient or authorized representative who has indicated his/her understanding and acceptance.     Plan Discussed with: CRNA and Surgeon  Anesthesia Plan Comments:        Anesthesia Quick Evaluation

## 2017-09-30 NOTE — Transfer of Care (Signed)
Immediate Anesthesia Transfer of Care Note  Patient: Bonnie [name]  Procedure(s) Performed: RIGHT TOTAL KNEE ARTHROPLASTY (Right Knee)  Patient Location: PACU  Anesthesia Type:MAC  Level of Consciousness: awake, alert  and oriented  Airway & Oxygen Therapy: Patient Spontanous Breathing  Post-op Assessment: Report given to RN and Post -op Vital signs reviewed and stable  Post vital signs: Reviewed and stable  Last Vitals:  Vitals Value Taken Time  BP 110/75 09/30/2017  4:02 PM  Temp    Pulse 57 09/30/2017  4:06 PM  Resp 10 09/30/2017  4:06 PM  SpO2 100 % 09/30/2017  4:06 PM  Vitals shown include unvalidated device data.  Last Pain:  Vitals:   09/30/17 1604  TempSrc:   PainSc: (P) 0-No pain         Complications: No apparent anesthesia complications

## 2017-09-30 NOTE — Anesthesia Procedure Notes (Signed)
Spinal  Patient location during procedure: OR Start time: 09/30/2017 1:50 PM End time: 09/30/2017 1:55 PM Staffing Anesthesiologist: Arta Brucessey, Miguel Medal, MD Performed: anesthesiologist  Preanesthetic Checklist Completed: patient identified, surgical consent, pre-op evaluation, timeout performed, IV checked, risks and benefits discussed and monitors and equipment checked Spinal Block Patient position: sitting Prep: DuraPrep Patient monitoring: heart rate, continuous pulse ox, blood pressure and cardiac monitor Approach: right paramedian Location: L3-4 Injection technique: single-shot Needle Needle type: Pencan  Needle gauge: 24 G Needle length: 9 cm Needle insertion depth: 7 cm

## 2017-10-01 ENCOUNTER — Other Ambulatory Visit: Payer: Self-pay

## 2017-10-01 ENCOUNTER — Encounter (HOSPITAL_COMMUNITY): Payer: Self-pay | Admitting: Orthopaedic Surgery

## 2017-10-01 LAB — BASIC METABOLIC PANEL
ANION GAP: 6 (ref 5–15)
BUN: 10 mg/dL (ref 8–23)
CALCIUM: 8.6 mg/dL — AB (ref 8.9–10.3)
CO2: 26 mmol/L (ref 22–32)
Chloride: 109 mmol/L (ref 98–111)
Creatinine, Ser: 0.79 mg/dL (ref 0.44–1.00)
Glucose, Bld: 148 mg/dL — ABNORMAL HIGH (ref 70–99)
POTASSIUM: 3.7 mmol/L (ref 3.5–5.1)
SODIUM: 141 mmol/L (ref 135–145)

## 2017-10-01 LAB — CBC
HCT: 36.7 % (ref 36.0–46.0)
Hemoglobin: 11.7 g/dL — ABNORMAL LOW (ref 12.0–15.0)
MCH: 29 pg (ref 26.0–34.0)
MCHC: 31.9 g/dL (ref 30.0–36.0)
MCV: 91.1 fL (ref 78.0–100.0)
PLATELETS: 168 10*3/uL (ref 150–400)
RBC: 4.03 MIL/uL (ref 3.87–5.11)
RDW: 13.2 % (ref 11.5–15.5)
WBC: 7.3 10*3/uL (ref 4.0–10.5)

## 2017-10-01 NOTE — Progress Notes (Signed)
CSW informed [facility] and that Admission Rep/Wendy will come to the hospital tomorrow to complete the needed paperwork.  Antony Blackbirdynthia Nimsi Males, [facility]gn[facility] D[facility] Lew[facility] Surgery CenterCSWA Clinical Social Worker [phone]

## 2017-10-01 NOTE — Evaluation (Signed)
Physical Therapy Evaluation Patient Details Name: Bonnie MaidensGlendene Aloi MRN: [id number] DOB: May 05, 1940 Today's Date: 10/01/2017   History of Present Illness  Pt is a 77 y/o female s/p elective R TKA. PMH including but not limited to arthritis and hypothyroidism.  Clinical Impression  Pt presented supine in bed with HOB elevated, awake and willing to participate in therapy session. Prior to admission, pt reported that she was independent with all functional mobility and ADLs. Pt enjoys gardening. Her husband recently and unexpectedly passed away; therefore, pt is requesting to d/c to a SNF for short term rehab prior to returning home. She currently requires min A overall for mobility and is very limited secondary to pain, fatigue and weakness. Pt would continue to benefit from skilled physical therapy services at this time while admitted and after d/c to address the below listed limitations in order to improve overall safety and independence with functional mobility.     Follow Up Recommendations SNF    Equipment Recommendations  None recommended by PT    Recommendations for Other Services       Precautions / Restrictions Precautions Precautions: Fall;Knee Precaution Booklet Issued: Yes (comment) Restrictions Weight Bearing Restrictions: Yes RLE Weight Bearing: Weight bearing as tolerated      Mobility  Bed Mobility Overal bed mobility: Needs Assistance Bed Mobility: Supine to Sit     Supine to sit: Min assist     General bed mobility comments: increased time and effort, HOB elevated, use of bed rail, min A to move R LE off of bed  Transfers Overall transfer level: Needs assistance Equipment used: Rolling walker (2 wheeled) Transfers: Sit to/from Stand Sit to Stand: Min assist         General transfer comment: cueing for safe hand placement, min A for stability with transition into standing from EOB  Ambulation/Gait Ambulation/Gait assistance: Min assist Gait Distance  (Feet): 10 Feet Assistive device: Rolling walker (2 wheeled) Gait Pattern/deviations: Step-to pattern;Decreased step length - right;Decreased step length - left;Decreased stance time - right;Decreased stride length;Decreased weight shift to right;Antalgic Gait velocity: decreased Gait velocity interpretation: <1.31 ft/sec, indicative of household ambulator General Gait Details: pt with slow, cautious and guarded gait pattern with unsteadiness using RW, min A for stability and safety, cueing for sequencing  Stairs            Wheelchair Mobility    Modified Rankin (Stroke Patients Only)       Balance Overall balance assessment: Needs assistance Sitting-balance support: Feet supported Sitting balance-Leahy Scale: Good     Standing balance support: During functional activity;Bilateral upper extremity supported Standing balance-Leahy Scale: Poor                               Pertinent Vitals/Pain Pain Assessment: 0-10 Pain Score: 2  Pain Location: R knee Pain Descriptors / Indicators: Sore Pain Intervention(s): Monitored during session;Repositioned    Home Living Family/patient expects to be discharged to:: Skilled nursing facility                 Additional Comments: pt stated that her husband recently passed unexpectedly and will need to go to SNF for short term rehab    Prior Function Level of Independence: Independent         Comments: enjoys gardening     Hand Dominance        Extremity/Trunk Assessment   Upper Extremity Assessment Upper Extremity Assessment: Overall WFL for tasks  assessed;Defer to OT evaluation    Lower Extremity Assessment Lower Extremity Assessment: RLE deficits/detail RLE Deficits / Details: pt with decreased strength and ROM limitations secondary to post-op pain and weakness. Sensation grossly intact       Communication   Communication: No difficulties  Cognition Arousal/Alertness: Awake/alert Behavior  During Therapy: WFL for tasks assessed/performed Overall Cognitive Status: Within Functional Limits for tasks assessed                                        General Comments      Exercises     Assessment/Plan    PT Assessment Patient needs continued PT services  PT Problem List Decreased strength;Decreased activity tolerance;Decreased range of motion;Decreased balance;Decreased mobility;Decreased coordination;Decreased knowledge of use of DME;Decreased safety awareness;Decreased knowledge of precautions;Pain       PT Treatment Interventions Gait training;DME instruction;Functional mobility training;Stair training;Therapeutic activities;Therapeutic exercise;Balance training;Neuromuscular re-education;Patient/family education    PT Goals (Current goals can be found in the Care Plan section)  Acute Rehab PT Goals Patient Stated Goal: decrease pain, return to gardening PT Goal Formulation: With patient Time For Goal Achievement: 10/15/17 Potential to Achieve Goals: Good    Frequency 7X/week   Barriers to discharge        Co-evaluation               AM-PAC PT "6 Clicks" Daily Activity  Outcome Measure Difficulty turning over in bed (including adjusting bedclothes, sheets and blankets)?: Unable Difficulty moving from lying on back to sitting on the side of the bed? : Unable Difficulty sitting down on and standing up from a chair with arms (e.g., wheelchair, bedside commode, etc,.)?: Unable Help needed moving to and from a bed to chair (including a wheelchair)?: A Little Help needed walking in hospital room?: A Little Help needed climbing 3-5 steps with a railing? : A Lot 6 Click Score: 11    End of Session Equipment Utilized During Treatment: Gait belt Activity Tolerance: Patient tolerated treatment well Patient left: in chair;with call bell/phone within reach;Other (comment)(nurse tech in room) Nurse Communication: Mobility status PT Visit  Diagnosis: Other abnormalities of gait and mobility (R26.89);Pain Pain - Right/Left: Right Pain - part of body: Knee    Time: 1610-9604 PT Time Calculation (min) (ACUTE ONLY): 18 min   Charges:   PT Evaluation $PT Eval Moderate Complexity: [address] [place], [place] [phone]   Alessandra Bevels Ivis Henneman 10/01/2017, 11:07 AM

## 2017-10-01 NOTE — Plan of Care (Signed)

## 2017-10-01 NOTE — Clinical Social Work Note (Signed)
Clinical Social Work Assessment  Patient Details  Name: Bonnie [name] MRN: [id number] Date of Birth: Jul 05, 1940  Date of referral:  10/01/17               Reason for consult:  Facility Placement                Permission sought to share information with:  Facility Medical sales representativeContact Representative, Family Supports Permission granted to share information::  Yes, Verbal Permission Granted  Name::     Bonnie [name]   Agency::  SNFs   Relationship::  Daughter   Contact Information:  [phone]  Housing/Transportation Living arrangements for the past 2 months:  Single Family Home Source of Information:  Patient Patient Interpreter Needed:  None Criminal Activity/Legal Involvement Pertinent to Current Situation/Hospitalization:  No - Comment as needed Significant Relationships:  Adult Children Lives with:  Self Do you feel safe going back to the place where you live?  No Need for family participation in patient care:  Yes (Comment)  Care giving concerns: CSW received consult for discharge needs. Patient was alert and oriented. CSW spoke with patient regarding PT recommendation of SNF placement at time of discharge. Patient reported that her spouse recently passed away and she lives alone but her daughter lives next door.      Social Worker assessment / plan:  CS[facility] with [facility].  Employment status:  Retired Health and safety inspectornsurance information:  Medicare PT Recommendations:  Skilled Nursing Facility Information / Referral to community resources:  Skilled Nursing Facility  Patient/Family's Response to care: Patient recognizes need for rehab before returning home and is agreeable to a SNF placement. Patient reported preference for [facility] or [facility] [place].  Patient/Family's Understanding of and Emotional Response to Diagnosis, Current Treatment, and Prognosis:  Patient is realistic regarding therapy needs and expressed being hopeful for SNF  placement. Patient expressed understanding of CSW role and discharge process as well as medical condition. No questions/concerns about plan or treatment at this time.  Emotional Assessment Appearance:  Appears stated age Attitude/Demeanor/Rapport:  Engaged Affect (typically observed):  Accepting, Appropriate Orientation:  Oriented to Self, Oriented to Place, Oriented to  Time, Oriented to Situation Alcohol / Substance use:  Not Applicable Psych involvement (Current and /or in the community):  No (Comment)  Discharge Needs  Concerns to be addressed:  Care Coordination Readmission within the last 30 days:  No Current discharge risk:  Dependent with Mobility Barriers to Discharge:  Continued Medical Work up   Enterprise ProductsCynthia N Lawonda Pretlow, LCSWA 10/01/2017, 12:12 PM

## 2017-10-01 NOTE — Evaluation (Signed)
Occupational Therapy Evaluation Patient Details Name: Bonnie [name] MRN: [id number] DOB: [birth year] Today's Date: 10/01/2017    History of Present Illness Pt is a 77 y/o female s/p elective R TKA. PMH including but not limited to arthritis and hypothyroidism.   Clinical Impression   PTA patient independent with ADLs, IADLs and mobility.  Currently requires setup assist for UB ADL, min assist for LB ADL, min guard for toileting, min assist for toilet transfers and min assist for bed mobility. Educated on safety, precautions, mobility and ADL compensatory techniques. She is requesting to d/c to SNF for short term rehab as her husband recently and unexpectedly passed away. She is limited by pain, fatigue and weakness, but anticipate patient will progress well with short term rehab after discharge.  Will continue to follow while admitted in order to maximize safety and independence.     Follow Up Recommendations  Supervision/Assistance - 24 hour;SNF    Equipment Recommendations  Other (comment)(TBD at next venue of care)    Recommendations for Other Services       Precautions / Restrictions Precautions Precautions: Fall;Knee Precaution Booklet Issued: Yes (comment) Precaution Comments: reviewed precautions with patient  Restrictions Weight Bearing Restrictions: Yes RLE Weight Bearing: Weight bearing as tolerated      Mobility Bed Mobility Overal bed mobility: Needs Assistance Bed Mobility: Supine to Sit;Sit to Supine     Supine to sit: Min assist Sit to supine: Min assist   General bed mobility comments: increased time and effort, HOB elevated, use of bed rail, min A to move R LE off of and back onto bed  Transfers Overall transfer level: Needs assistance Equipment used: Rolling walker (2 wheeled) Transfers: Sit to/from Stand Sit to Stand: Min assist         General transfer comment: increased time and effort, cueing for hand placement and safety    Balance Overall  balance assessment: Needs assistance Sitting-balance support: Feet supported Sitting balance-Leahy Scale: Good     Standing balance support: No upper extremity supported;During functional activity Standing balance-Leahy Scale: Poor Standing balance comment: min guard for safety during self care tasks                            ADL either performed or assessed with clinical judgement   ADL Overall ADL's : Needs assistance/impaired     Grooming: Wash/dry hands;Min guard;Standing   Upper Body Bathing: Supervision/ safety;Set up;Sitting   Lower Body Bathing: Minimal assistance;Sit to/from stand   Upper Body Dressing : Set up;Sitting   Lower Body Dressing: Minimal assistance;Sitting/lateral leans   Toilet Transfer: Minimal assistance;BSC;Regular Toilet;Ambulation;RW(3:1 over toilet ) Toilet Transfer Details (indicate cue type and reason): increased time and effort, cueing for hand placement and safety Toileting- Clothing Manipulation and Hygiene: Min guard;Sit to/from stand       Functional mobility during ADLs: Min guard;Rolling walker(cueing for walker management and safety) General ADL Comments: Completed bed mobility, ADLs, toileting/transfers, and short distance mobility in room.     Vision   Vision Assessment?: No apparent visual deficits     Perception     Praxis      Pertinent Vitals/Pain Pain Assessment: Faces Faces Pain Scale: Hurts a little bit Pain Location: R knee Pain Descriptors / Indicators: Sore Pain Intervention(s): Monitored during session;Repositioned     Hand Dominance     Extremity/Trunk Assessment Upper Extremity Assessment Upper Extremity Assessment: Overall WFL for tasks assessed   Lower Extremity Assessment Lower  Extremity Assessment: Defer to PT evaluation RLE Deficits / Details: s/p R knee sx   Cervical / Trunk Assessment Cervical / Trunk Assessment: Normal   Communication Communication Communication: No difficulties    Cognition Arousal/Alertness: Awake/alert Behavior During Therapy: WFL for tasks assessed/performed Overall Cognitive Status: Within Functional Limits for tasks assessed                                     General Comments       Exercises Exercises: Total Joint Total Joint Exercises Ankle Circles/Pumps: AROM;Right;10 reps;Supine Quad Sets: AROM;Strengthening;Right;10 reps;Supine Hip ABduction/ADduction: AAROM;Right;10 reps;Supine   Shoulder Instructions      Home Living Family/patient expects to be discharged to:: Skilled nursing facility                                 Additional Comments: pt stated that her husband recently passed unexpectedly and will need to go to SNF for short term rehab      Prior Functioning/Environment Level of Independence: Independent        Comments: independent ADLs, IADLs, mobility; enjoys gardening         OT Problem List: Decreased activity tolerance;Impaired balance (sitting and/or standing);Decreased knowledge of use of DME or AE;Decreased safety awareness;Decreased knowledge of precautions;Pain      OT Treatment/Interventions: Self-care/ADL training;Therapeutic exercise;DME and/or AE instruction;Energy conservation;Therapeutic activities;Patient/family education;Balance training    OT Goals(Current goals can be found in the care plan section) Acute Rehab OT Goals Patient Stated Goal: decrease pain, return to gardening OT Goal Formulation: With patient Time For Goal Achievement: 10/15/17 Potential to Achieve Goals: Good  OT Frequency: Min 2X/week   Barriers to D/C:            Co-evaluation              AM-PAC PT "6 Clicks" Daily Activity     Outcome Measure Help from another person eating meals?: None Help from another person taking care of personal grooming?: A Little Help from another person toileting, which includes using toliet, bedpan, or urinal?: A Little Help from another person  bathing (including washing, rinsing, drying)?: A Little Help from another person to put on and taking off regular upper body clothing?: None Help from another person to put on and taking off regular lower body clothing?: A Little 6 Click Score: 20   End of Session CPM Right Knee CPM Right Knee: Off  Activity Tolerance:   Patient left:    OT Visit Diagnosis: Unsteadiness on feet (R26.81);Pain Pain - Right/Left: Right Pain - part of body: Knee                Time: 1500-1520 OT Time Calculation (min): 20 min Charges:  OT General Charges $OT Visit: 1 Visit OT Evaluation $OT Eval Low Complexity: 1 Low  Bonnie [name], OTR/L  Pager [phone]   Bonnie [name] 10/01/2017, 4:56 PM

## 2017-10-01 NOTE — Anesthesia Postprocedure Evaluation (Addendum)
Anesthesia Post Note  Patient: Mikiya Nebergall  Procedure(s) Performed: RIGHT TOTAL KNEE ARTHROPLASTY (Right Knee)     Patient location during evaluation: PACU Anesthesia Type: MAC, Regional and Spinal Level of consciousness: awake and alert Pain management: pain level controlled Vital Signs Assessment: post-procedure vital signs reviewed and stable Respiratory status: spontaneous breathing, nonlabored ventilation, respiratory function stable and patient connected to nasal cannula oxygen Cardiovascular status: stable and blood pressure returned to baseline Postop Assessment: no apparent nausea or vomiting and spinal receding Anesthetic complications: no    Last Vitals:  Vitals:   10/01/17 0132 10/01/17 0352  BP: 119/71 123/67  Pulse: 83 79  Resp: 16 16  Temp: 37.7 C 37.7 C  SpO2:  98%    Last Pain:  Vitals:   10/01/17 0650  TempSrc:   PainSc: 5                  Belkis Norbeck

## 2017-10-01 NOTE — [place] FL2 (Signed)
[place] MEDICAID FL2 LEVEL OF CARE SCREENING TOOL     IDENTIFICATION  Patient Name: Bonnie [name] Birthdate: 10/16/1940 Sex: female Admission Date (Current Location): 09/30/2017  [facility] [place]  [employer]   Facility and Address:  [place], [address], [place], [place] [address]      Provider Number:    Attending Physician Name and Address:  Kathryne HitchBlackman, Christopher Y,*  Relative Name and Phone Number:  Liz BeachDeana Norton [phone]    Current Level of Care: Hospital Recommended Level of Care: Skilled Nursing Facility Prior Approval Number:    Date Approved/Denied:   PASRR Number: [id number]  Discharge Plan: SNF    Current Diagnoses: Patient Active Problem List   Diagnosis Date Noted  . 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 Unilateral primary osteoarthritis, right knee 08/19/2017    Orientation RESPIRATION BLADDER Height & Weight     Self, Time, Situation, Place  Normal Continent Weight: 208 lb 14.4 oz (94.8 kg) Height:  5\' 2"  (157.5 cm)  BEHAVIORAL SYMPTOMS/MOOD NEUROLOGICAL BOWEL NUTRITION STATUS      Continent Diet(please see DC Summary)  AMBULATORY STATUS COMMUNICATION OF NEEDS Skin     Verbally Other (Comment)(incision(closed) knee/right)                       Personal Care Assistance Level of Assistance  Bathing, Feeding, Dressing Bathing Assistance: Maximum assistance Feeding assistance: Independent Dressing Assistance: Limited assistance     Functional Limitations Info  Sight, Hearing, Speech Sight Info: Adequate Hearing Info: Adequate Speech Info: Adequate    SPECIAL CARE FACTORS FREQUENCY  PT (By licensed PT)     PT Frequency: 7x per week              Contractures Contractures Info: Not present    Additional Factors Info  Allergies Code Status Info: FULL Allergies Info: Metoclopramide           Current Medications (10/01/2017):  This is the current hospital  active medication list Current Facility-Administered Medications  Medication Dose Route Frequency Provider Last Rate Last Dose  . 0.9 %  sodium chloride infusion   Intravenous Continuous Kathryne HitchBlackman, Christopher Y, MD 75 mL/hr at 10/01/17 [phone]    . acetaminophen (TYLENOL) tablet 325-650 mg  325-650 mg Oral Q6H PRN Kathryne HitchBlackman, Christopher Y, MD      . ALPRAZolam Prudy Feeler(XANAX) tablet 0.25 mg  0.25 mg Oral Daily PRN Kathryne HitchBlackman, Christopher Y, MD      . alum & mag hydroxide-simeth (MAALOX/MYLANTA) 200-200-20 MG/5ML suspension 30 mL  30 mL Oral Q4H PRN Kathryne HitchBlackman, Christopher Y, MD      . aspirin EC tablet 325 mg  325 mg Oral BID Kathryne HitchBlackman, Christopher Y, MD   325 mg at 10/01/17 1104  . diphenhydrAMINE (BENADRYL) 12.5 MG/5ML elixir 12.5-25 mg  12.5-25 mg Oral Q4H PRN Kathryne HitchBlackman, Christopher Y, MD      . docusate sodium (COLACE) capsule 100 mg  100 mg Oral BID Kathryne HitchBlackman, Christopher Y, MD   100 mg at 10/01/17 1104  . HYDROmorphone (DILAUDID) injection 0.5-1 mg  0.5-1 mg Intravenous Q4H PRN Kathryne HitchBlackman, Christopher Y, MD      . levothyroxine (SYNTHROID, LEVOTHROID) tablet 150 mcg  150 mcg Oral QAC breakfast Kathryne HitchBlackman, Christopher Y, MD   150 mcg at 10/01/17 [id number]  . menthol-cetylpyridinium (CEPACOL) lozenge 3 mg  1 lozenge Oral PRN Kathryne HitchBlackman, Christopher Y, MD       Or  . phenol [facility](CHLORASEPTIC) mouth spray  1 spray  1 spray Mouth/Throat PRN Kathryne Hitch, MD      . methocarbamol (ROBAXIN) tablet 500 mg  500 mg Oral Q6H PRN Kathryne Hitch, MD   500 mg at 10/01/17 0650   Or  . methocarbamol (ROBAXIN) 500 mg in dextrose 5 % 50 mL IVPB  500 mg Intravenous Q6H PRN Kathryne Hitch, MD      . ondansetron [facility]) tablet 4 mg  4 mg Oral Q6H PRN Kathryne Hitch, MD       Or  . ondansetron [facility]) injection 4 mg  4 mg Intravenous Q6H PRN Kathryne Hitch, MD      . oxyCODONE (Oxy IR/ROXICODONE) immediate release tablet 10-15 mg  10-15 mg Oral Q4H PRN Kathryne Hitch, MD      . oxyCODONE  (Oxy IR/ROXICODONE) immediate release tablet 5-10 mg  5-10 mg Oral Q4H PRN Kathryne Hitch, MD   5 mg at 10/01/17 1115  . pantoprazole (PROTONIX) EC tablet 40 mg  40 mg Oral Daily Kathryne Hitch, MD   40 mg at 10/01/17 1103  . polyethylene glycol (MIRALAX / GLYCOLAX) packet 17 g  17 g Oral Daily PRN Kathryne Hitch, MD         Discharge Medications: Please see discharge summary for a list of discharge medications.  Relevant Imaging Results:  Relevant Lab Results:   Additional Information SSN # [id number]  Eduard Roux, LCSWA

## 2017-10-01 NOTE — Progress Notes (Signed)
Subjective: 1 Day Post-Op Procedure(s) (LRB): RIGHT TOTAL KNEE ARTHROPLASTY (Right) Patient reports pain as moderate.    Objective: Vital signs in last 24 hours: Temp:  [97 F (36.1 C)-100.6 F (38.1 C)] 99.8 F (37.7 C) (08/28 0352) Pulse Rate:  [55-86] 79 (08/28 0352) Resp:  [12-18] 16 (08/28 0352) BP: (105-146)/(58-84) 123/67 (08/28 0352) SpO2:  [96 %-100 %] 98 % (08/28 0352) Weight:  [94.8 kg] 94.8 kg (08/28 0132)  Intake/Output from previous day: 08/27 0701 - 08/28 0700 In: 2328.1 [I.V.:1756.4; IV Piggyback:571.7] Out: 350 [Urine:300; Blood:50] Intake/Output this shift: No intake/output data recorded.  Recent Labs    10/01/17 0356  HGB 11.7*   Recent Labs    10/01/17 0356  WBC 7.3  RBC 4.03  HCT 36.7  PLT 168   Recent Labs    10/01/17 0356  NA 141  K 3.7  CL 109  CO2 26  BUN 10  CREATININE 0.79  GLUCOSE 148*  CALCIUM 8.6*   No results for input(s): LABPT, INR in the last 72 hours.  Sensation intact distally Intact pulses distally Dorsiflexion/Plantar flexion intact Incision: dressing C/D/I Compartment soft  Anticipated LOS equal to or greater than 2 midnights due to - Age 75 and older with one or more of the following:  - Obesity  - Expected need for hospital services (PT, OT, Nursing) required for safe  discharge  - Anticipated need for postoperative skilled nursing care or inpatient rehab  - Active co-morbidities: None OR   - Unanticipated findings during/Post Surgery: None  - Patient is a high risk of re-admission due to: None   Assessment/Plan: 1 Day Post-Op Procedure(s) (LRB): RIGHT TOTAL KNEE ARTHROPLASTY (Right) Up with therapy Discharge to SNF by Friday.    Kathryne HitchChristopher Y Bobbe Quilter 10/01/2017, 7:30 AM

## 2017-10-01 NOTE — Progress Notes (Addendum)
Physical Therapy Treatment Patient Details Name: Bonnie MaidensGlendene Cristo MRN: [id number] DOB: 1940/07/24 Today's Date: 10/01/2017    History of Present Illness Pt is a 77 y/o female s/p elective R TKA. PMH including but not limited to arthritis and hypothyroidism.    PT Comments    Pt making slow progress with functional mobility and remains limited secondary to R knee pain, weakness and fatigue. Pt would continue to benefit from skilled physical therapy services at this time while admitted and after d/c to address the below listed limitations in order to improve overall safety and independence with functional mobility.    Follow Up Recommendations  SNF     Equipment Recommendations  None recommended by PT    Recommendations for Other Services       Precautions / Restrictions Precautions Precautions: Fall;Knee Precaution Booklet Issued: Yes (comment) Precaution Comments: reviewed precautions with patient  Restrictions Weight Bearing Restrictions: Yes RLE Weight Bearing: Weight bearing as tolerated    Mobility  Bed Mobility Overal bed mobility: Needs Assistance Bed Mobility: Supine to Sit;Sit to Supine     Supine to sit: Min assist Sit to supine: Min assist   General bed mobility comments: increased time and effort, HOB elevated, use of bed rail, min A to move R LE off of and back onto bed  Transfers Overall transfer level: Needs assistance Equipment used: Rolling walker (2 wheeled) Transfers: Sit to/from Stand Sit to Stand: Min assist         General transfer comment: increased time and effort, cueing for hand placement and safety  Ambulation/Gait Ambulation/Gait assistance: Min assist;Min guard Gait Distance (Feet): 25 Feet Assistive device: Rolling walker (2 wheeled) Gait Pattern/deviations: Step-to pattern;Decreased step length - right;Decreased step length - left;Decreased stance time - right;Decreased stride length;Decreased weight shift to right;Antalgic Gait  velocity: decreased Gait velocity interpretation: <1.31 ft/sec, indicative of household ambulator General Gait Details: pt with slow, cautious and guarded gait pattern with unsteadiness using RW, constant min guard for safety and intermittent min A for stability and safety, cueing for sequencing   Stairs             Wheelchair Mobility    Modified Rankin (Stroke Patients Only)       Balance Overall balance assessment: Needs assistance Sitting-balance support: Feet supported Sitting balance-Leahy Scale: Good     Standing balance support: No upper extremity supported;During functional activity Standing balance-Leahy Scale: Poor                            Cognition Arousal/Alertness: Awake/alert Behavior During Therapy: WFL for tasks assessed/performed Overall Cognitive Status: Within Functional Limits for tasks assessed                                        Exercises Total Joint Exercises Ankle Circles/Pumps: AROM;Right;10 reps;Supine Quad Sets: AROM;Strengthening;Right;10 reps;Supine Hip ABduction/ADduction: AAROM;Right;10 reps;Supine Goniometric ROM: Flexion ~75 degrees; Extension lacking ~10 degrees to neutral    General Comments        Pertinent Vitals/Pain Pain Assessment: Faces Faces Pain Scale: Hurts a little bit Pain Location: R knee Pain Descriptors / Indicators: Sore Pain Intervention(s): Monitored during session;Repositioned    Home Living Family/patient expects to be discharged to:: Skilled nursing facility     Additional Comments: pt stated that her husband recently passed unexpectedly and will need to go to SNF for  short term rehab    Prior Function Level of Independence: Independent      Comments: independent ADLs, IADLs, mobility; enjoys gardening    PT Goals (current goals can now be found in the care plan section) Acute Rehab PT Goals Patient Stated Goal: decrease pain, return to gardening PT Goal  Formulation: With patient Time For Goal Achievement: 10/15/17 Potential to Achieve Goals: Good Progress towards PT goals: Progressing toward goals    Frequency    7X/week   PT Plan Current plan remains appropriate       AM-PAC PT "6 Clicks" Daily Activity  Outcome Measure  Difficulty turning over in bed (including adjusting bedclothes, sheets and blankets)?: Unable Difficulty moving from lying on back to sitting on the side of the bed? : Unable Difficulty sitting down on and standing up from a chair with arms (e.g., wheelchair, bedside commode, etc,.)?: Unable Help needed moving to and from a bed to chair (including a wheelchair)?: A Little Help needed walking in hospital room?: A Little Help needed climbing 3-5 steps with a railing? : A Lot 6 Click Score: 11    End of Session Equipment Utilized During Treatment: Gait belt Activity Tolerance: Patient tolerated treatment well Patient left: in bed;with call bell/phone within reach;with family/visitor present Nurse Communication: Mobility status PT Visit Diagnosis: Other abnormalities of gait and mobility (R26.89);Pain Pain - Right/Left: Right Pain - part of body: Knee     Time: 1610-9604 PT Time Calculation (min) (ACUTE ONLY): 21 min  Charges:  $Gait Training: 8-22 mins                     Justice, [place], [place] [phone]    Alessandra Bevels Danner Paulding 10/01/2017, 5:09 PM

## 2017-10-02 MED ORDER — OXYCODONE HCL 5 MG PO TABS: 5.0000 mg | ORAL_TABLET | ORAL | 0 refills | Status: DC | PRN

## 2017-10-02 MED ORDER — ASPIRIN 325 MG PO TBEC: 325.0000 mg | DELAYED_RELEASE_TABLET | Freq: Two times a day (BID) | ORAL | 0 refills | Status: DC

## 2017-10-02 MED ORDER — METHOCARBAMOL 500 MG PO TABS: 500.0000 mg | ORAL_TABLET | Freq: Four times a day (QID) | ORAL | 0 refills | Status: DC | PRN

## 2017-10-02 NOTE — Progress Notes (Signed)
Subjective: 2 Days Post-Op Procedure(s) (LRB): RIGHT TOTAL KNEE ARTHROPLASTY (Right) Patient reports pain as moderate.  Working well with therapy.  Social Work has seen her as well.  Objective: Vital signs in last 24 hours: Temp:  [99.1 F (37.3 C)-100.2 F (37.9 C)] 99.1 F (37.3 C) (08/29 0423) Pulse Rate:  [68-80] 68 (08/29 0423) Resp:  [14-17] 14 (08/29 0423) BP: (135-143)/(56-66) 135/64 (08/29 0423) SpO2:  [97 %-100 %] 97 % (08/29 0423)  Intake/Output from previous day: 08/28 0701 - 08/29 0700 In: 923.8 [P.O.:720; I.V.:203.8] Out: -  Intake/Output this shift: Total I/O In: 240 [P.O.:240] Out: -   Recent Labs    10/01/17 0356  HGB 11.7*   Recent Labs    10/01/17 0356  WBC 7.3  RBC 4.03  HCT 36.7  PLT 168   Recent Labs    10/01/17 0356  NA 141  K 3.7  CL 109  CO2 26  BUN 10  CREATININE 0.79  GLUCOSE 148*  CALCIUM 8.6*   No results for input(s): LABPT, INR in the last 72 hours.  Sensation intact distally Intact pulses distally Dorsiflexion/Plantar flexion intact Incision: scant drainage No cellulitis present Compartment soft  Assessment/Plan: 2 Days Post-Op Procedure(s) (LRB): RIGHT TOTAL KNEE ARTHROPLASTY (Right) Up with therapy Plan for discharge tomorrow Discharge to SNF    Kathryne HitchChristopher Y Allenmichael Mcpartlin 10/02/2017, 9:07 AM

## 2017-10-02 NOTE — Discharge Instructions (Signed)

## 2017-10-02 NOTE — Progress Notes (Signed)
Physical Therapy Treatment Patient Details Name: Bonnie [name] MRN: [id number] DOB: 1940-12-04 Today's Date: 10/02/2017    History of Present Illness Pt is a 77 y/o female s/p elective R TKA. PMH including but not limited to arthritis and hypothyroidism.    PT Comments    Pt making slow, steady progress with mobility. She remains limited secondary to pain, fatigue and weakness. Of note, pt's SPO2 decreases on RA with activity to as low as 84%. Donned 2L of O2 via [place] at end of session with SPO2 returning to mid 90's with pt sitting in recliner chair. Pt would continue to benefit from skilled physical therapy services at this time while admitted and after d/c to address the below listed limitations in order to improve overall safety and independence with functional mobility.   Follow Up Recommendations  SNF     Equipment Recommendations  None recommended by PT    Recommendations for Other Services       Precautions / Restrictions Precautions Precautions: Fall;Knee Restrictions Weight Bearing Restrictions: Yes RLE Weight Bearing: Weight bearing as tolerated    Mobility  Bed Mobility               General bed mobility comments: pt in bathroom with nurse tech upon arrival  Transfers Overall transfer level: Needs assistance Equipment used: Rolling walker (2 wheeled) Transfers: Sit to/from Stand Sit to Stand: Min guard         General transfer comment: min guard to sit into recliner chair from standing position with RW; lots of verbal cueing for safety and technique  Ambulation/Gait Ambulation/Gait assistance: Min assist;Min guard Gait Distance (Feet): 25 Feet Assistive device: Rolling walker (2 wheeled) Gait Pattern/deviations: Step-to pattern;Decreased step length - right;Decreased step length - left;Decreased stance time - right;Decreased stride length;Decreased weight shift to right;Antalgic Gait velocity: decreased Gait velocity interpretation: <1.31 ft/sec,  indicative of household ambulator General Gait Details: pt with slow, cautious and guarded gait pattern with unsteadiness using RW, constant min guard for safety and intermittent min A for stability and safety, cueing for sequencing   Stairs             Wheelchair Mobility    Modified Rankin (Stroke Patients Only)       Balance Overall balance assessment: Needs assistance Sitting-balance support: Feet supported Sitting balance-Leahy Scale: Good     Standing balance support: During functional activity;Single extremity supported;Bilateral upper extremity supported Standing balance-Leahy Scale: Poor                              Cognition Arousal/Alertness: Awake/alert Behavior During Therapy: WFL for tasks assessed/performed Overall Cognitive Status: Within Functional Limits for tasks assessed                                        Exercises Total Joint Exercises Long Arc Quad: AAROM;Right;10 reps;Seated Knee Flexion: AAROM;Right;10 reps;Seated Goniometric ROM: Flexion = 67 degrees; Extension = lacking 15 degrees to neutral    General Comments        Pertinent Vitals/Pain Pain Assessment: Faces Faces Pain Scale: Hurts a little bit Pain Location: R knee Pain Descriptors / Indicators: Sore Pain Intervention(s): Monitored during session;Repositioned    Home Living                      Prior Function  PT Goals (current goals can now be found in the care plan section) Acute Rehab PT Goals PT Goal Formulation: With patient Time For Goal Achievement: 10/15/17 Potential to Achieve Goals: Good Progress towards PT goals: Progressing toward goals    Frequency    7X/week      PT Plan Current plan remains appropriate    Co-evaluation              AM-PAC PT "6 Clicks" Daily Activity  Outcome Measure  Difficulty turning over in bed (including adjusting bedclothes, sheets and blankets)?:  Unable Difficulty moving from lying on back to sitting on the side of the bed? : Unable Difficulty sitting down on and standing up from a chair with arms (e.g., wheelchair, bedside commode, etc,.)?: Unable Help needed moving to and from a bed to chair (including a wheelchair)?: A Little Help needed walking in hospital room?: A Little Help needed climbing 3-5 steps with a railing? : A Lot 6 Click Score: 11    End of Session Equipment Utilized During Treatment: Gait belt Activity Tolerance: Patient tolerated treatment well Patient left: in chair;with call bell/phone within reach Nurse Communication: Mobility status PT Visit Diagnosis: Other abnormalities of gait and mobility (R26.89);Pain Pain - Right/Left: Right Pain - part of body: Knee     Time: 1027-2536 PT Time Calculation (min) (ACUTE ONLY): 16 min  Charges:  $Gait Training: 8-22 mins                     Deborah Chalk, [place], DPT  Acute Rehabilitation Services Pager [phone] Office [phone]     Bonnie [name] 10/02/2017, 10:32 AM

## 2017-10-02 NOTE — Progress Notes (Addendum)
Physical Therapy Treatment Patient Details Name: Bonnie [name] MRN: [id number] DOB: [birth year] Today's Date: 10/02/2017    History of Present Illness Pt is a 77 y/o female s/p elective R TKA. PMH including but not limited to arthritis and hypothyroidism.    PT Comments    Focus of session was on LE therex (see below for details). Pt would continue to benefit from skilled physical therapy services at this time while admitted and after d/c to address the below listed limitations in order to improve overall safety and independence with functional mobility.    Follow Up Recommendations  SNF     Equipment Recommendations  None recommended by PT    Recommendations for Other Services       Precautions / Restrictions Precautions Precautions: Fall;Knee Restrictions Weight Bearing Restrictions: Yes RLE Weight Bearing: Weight bearing as tolerated    Mobility  Bed Mobility Overal bed mobility: Needs Assistance Bed Mobility: Supine to Sit;Sit to Supine     Supine to sit: Min assist Sit to supine: Min assist   General bed mobility comments: min A for R LE movement  Transfers Overall transfer level: Needs assistance Equipment used: Rolling walker (2 wheeled) Transfers: Sit to/from Stand Sit to Stand: Min assist   General transfer comment: min A for stability and to power into standing from EOB x1 and from toilet x1  Ambulation/Gait Ambulation/Gait assistance: Min guard;Min assist Gait Distance (Feet): 20 Feet Assistive device: Rolling walker (2 wheeled) Gait Pattern/deviations: Step-to pattern;Decreased step length - right;Decreased step length - left;Decreased stance time - right;Decreased stride length;Decreased weight shift to right;Antalgic Gait velocity: decreased Gait velocity interpretation: <1.31 ft/sec, indicative of household ambulator General Gait Details: pt with slow, cautious and guarded gait pattern with unsteadiness using RW, constant min guard for safety and  intermittent min A for stability and safety, cueing for sequencing    Modified Rankin (Stroke Patients Only)       Balance Overall balance assessment: Needs assistance Sitting-balance support: Feet supported Sitting balance-Leahy Scale: Good     Standing balance support: During functional activity;Single extremity supported;Bilateral upper extremity supported Standing balance-Leahy Scale: Poor      Cognition Arousal/Alertness: Awake/alert Behavior During Therapy: WFL for tasks assessed/performed Overall Cognitive Status: Within Functional Limits for tasks assessed       Exercises Total Joint Exercises Ankle Circles/Pumps: AROM;Right;10 reps;Supine Quad Sets: AROM;Strengthening;Right;10 reps;Supine Gluteal Sets: AROM;Strengthening;Both;10 reps;Supine Short Arc Quad: AROM;Strengthening;Right;10 reps;Supine Heel Slides: AROM;Strengthening;Right;10 reps;Supine Hip ABduction/ADduction: AAROM;Right;10 reps;Supine Straight Leg Raises: AAROM;Right;10 reps;Supine    General Comments        Pertinent Vitals/Pain Pain Assessment: 0-10 Pain Score: 5  Pain Location: R knee Pain Descriptors / Indicators: Sore Pain Intervention(s): Monitored during session;Repositioned    Home Living      Prior Function      PT Goals (current goals can now be found in the care plan section) Acute Rehab PT Goals PT Goal Formulation: With patient Time For Goal Achievement: 10/15/17 Potential to Achieve Goals: Good Progress towards PT goals: Progressing toward goals    Frequency    7X/week      PT Plan Current plan remains appropriate    Co-evaluation      AM-PAC PT "6 Clicks" Daily Activity  Outcome Measure  Difficulty turning over in bed (including adjusting bedclothes, sheets and blankets)?: Unable Difficulty moving from lying on back to sitting on the side of the bed? : Unable Difficulty sitting down on and standing up from a chair with arms (e.g., wheelchair, bedside  commode, etc,.)?: Unable Help needed moving to and from a bed to chair (including a wheelchair)?: A Little Help needed walking in hospital room?: A Little Help needed climbing 3-5 steps with a railing? : A Lot 6 Click Score: 11    End of Session Equipment Utilized During Treatment: Gait belt Activity Tolerance: Patient tolerated treatment well Patient left: in bed;with call bell/phone within reach;in CPM Nurse Communication: Mobility status PT Visit Diagnosis: Other abnormalities of gait and mobility (R26.89);Pain Pain - Right/Left: Right Pain - part of body: Knee     Time: [id number] PT Time Calculation (min) (ACUTE ONLY): 24 min  Charges:  $Gait Training: 8-22 mins $Therapeutic Exercise: 8-22 mins                     Deborah ChalkJennifer Novali Vollman, PT, DPT  Acute Rehabilitation Services Pager [phone] Office [phone]     Alessandra BevelsJennifer M Oluwaseyi Tull 10/02/2017, 4:04 PM

## 2017-10-02 NOTE — Plan of Care (Signed)
  Problem: Nutrition: Goal: Adequate nutrition will be maintained Outcome: Progressing   Problem: Elimination: Goal: Will not experience complications related to bowel motility Outcome: Progressing   Problem: Pain Managment: Goal: General experience of comfort will improve Outcome: Progressing   Problem: Skin Integrity: Goal: Risk for impaired skin integrity will decrease Outcome: Progressing   Problem: Safety: Goal: Ability to remain free from injury will improve Outcome: Progressing

## 2017-10-03 NOTE — Progress Notes (Signed)
Patient ID: Bonnie [name], female   DOB: 06/14/1940, 77 y.o.   MRN: [id number] No acute changes.  Vitals stable.  Right knee stable.  Can be discharged to skilled nursing today.

## 2017-10-03 NOTE — Clinical Social Work Placement (Addendum)
   CLINICAL SOCIAL WORK PLACEMENT  NOTE  Date:  10/03/2017  Patient Details  Name: Bonnie [name] MRN: [id number] Date of Birth: 1940-10-30  Clinical Social Work is seeking post-discharge placement for this patient at the Skilled  Nursing Facility level of care (*CSW will initial, date and re-position this form in  chart as items are completed):      Patient/family provided with [facility] Clinical Social Work Department's list of facilities offering this level of care within the geographic area requested by the patient (or if unable, by the patient's family).  Yes   Patient/family informed of their freedom to choose among providers that offer the needed level of care, that participate in Medicare, Medicaid or managed care program needed by the patient, have an available bed and are willing to accept the patient.      Patient/family informed of [facility]'s ownership interest in [facility] and [facility], as well as of the fact that they are under no obligation to receive care at these facilities.  PASRR submitted to EDS on       PASRR number received on 10/01/17     Existing PASRR number confirmed on       FL2 transmitted to all facilities in geographic area requested by pt/family on 10/01/17     FL2 transmitted to all facilities within larger geographic area on       Patient informed that his/her managed care company has contracts with or will negotiate with certain facilities, including the following:        Yes   Patient/family informed of bed offers received.  [facility] at [facility]     Physician recommends and patient chooses bed at      Patient to be transferred to [facility] on 10/03/17.  Patient to be transferred to facility by [facility]     Patient family notified on 10/03/17 of transfer.  Name of family member notified:  Pt alert and oriented x4. CSW attempted to reach pt's spouse and daughter--unsuccessful. PHYSICIAN       Additional  Comment:    _______________________________________________ Maree KrabbeBridget A Lucillia Corson, LCSW 10/03/2017, 9:46 AM

## 2017-10-03 NOTE — Clinical Social Work Note (Addendum)
Clinical Social Worker facilitated patient discharge including contacting patient family and facility to confirm patient discharge plans.  Clinical information faxed to facility and family agreeable with plan.  Per patient her daughter will [facility]  RN to call [phone] for report prior to discharge.  Clinical Social Worker will sign off for now as social work intervention is no longer needed. Please consult us again if new need arises.  Velora MediateBridget Danell Verno, MSW [phone]

## 2017-10-03 NOTE — Progress Notes (Signed)
Called facility and gave report to nurse Asher MuirJamie, Pt to discharge to facility with belongings accompanied by daughter.

## 2017-10-03 NOTE — Discharge Summary (Signed)
Patient ID: Bonnie [name] MRN: [id number] DOB/AGE: March 04, 1940 77 y.o.  Admit date: 09/30/2017 Discharge date: 10/03/2017  Admission Diagnoses:  Principal Problem:   Unilateral primary osteoarthritis, right knee Active Problems:   Status post total knee replacement, right   Discharge Diagnoses:  Same  Past Medical History:  Diagnosis Date  . Anxiety   . Arthritis   . Hypothyroidism   . Neutropenia (HCC)    had this in the past, resolve in 2014  . Sleep apnea    "mild"-does not use cpap    Surgeries: Procedure(s): RIGHT TOTAL KNEE ARTHROPLASTY on 09/30/2017   Consultants:   Discharged Condition: Improved  Hospital Course: Kynsley Whitehouse is an 77 y.o. female who was admitted 09/30/2017 for operative treatment ofUnilateral primary osteoarthritis, right knee. Patient has severe unremitting pain that affects sleep, daily activities, and work/hobbies. After pre-op clearance the patient was taken to the operating room on 09/30/2017 and underwent  Procedure(s): RIGHT TOTAL KNEE ARTHROPLASTY.    Patient was given perioperative antibiotics:  Anti-infectives (From admission, onward)   Start     Dose/Rate Route Frequency Ordered Stop   09/30/17 1800  ceFAZolin (ANCEF) IVPB 1 g/50 mL premix     1 g 100 mL/hr over 30 Minutes Intravenous Every 6 hours 09/30/17 1744 10/01/17 0045   09/30/17 1430  ceFAZolin (ANCEF) IVPB 2g/100 mL premix     2 g 200 mL/hr over 30 Minutes Intravenous To ShortStay Surgical 09/29/17 1033 09/30/17 1357       Patient was given sequential compression devices, early ambulation, and chemoprophylaxis to prevent DVT.  Patient benefited maximally from hospital stay and there were no complications.    Recent vital signs:  Patient Vitals for the past 24 hrs:  BP Temp Temp src Pulse Resp SpO2  10/03/17 0548 (!) 116/53 98.9 F (37.2 C) Oral 75 14 96 %  10/02/17 1950 126/67 99.3 F (37.4 C) Oral 83 15 99 %  10/02/17 1217 121/61 99.8 F (37.7 C) Oral 79 14 95  %     Recent laboratory studies:  Recent Labs    10/01/17 0356  WBC 7.3  HGB 11.7*  HCT 36.7  PLT 168  NA 141  K 3.7  CL 109  CO2 26  BUN 10  CREATININE 0.79  GLUCOSE 148*  CALCIUM 8.6*     Discharge Medications:   Allergies as of 10/03/2017      Reactions   Metoclopramide Rash      Medication List    STOP taking these medications   ibuprofen 200 MG tablet Commonly known as:  ADVIL,MOTRIN   naproxen sodium 220 MG tablet Commonly known as:  ALEVE     TAKE these medications   ALPRAZolam 0.25 MG tablet Commonly known as:  XANAX Take 0.25 mg by mouth daily as needed for anxiety or sleep.   aspirin 325 MG EC tablet Take 1 tablet (325 mg total) by mouth 2 (two) times daily at 10 am and 4 pm.   levothyroxine 150 MCG tablet Commonly known as:  SYNTHROID, LEVOTHROID Take 150 mcg by mouth daily.   methocarbamol 500 MG tablet Commonly known as:  ROBAXIN Take 1 tablet (500 mg total) by mouth every 6 (six) hours as needed for muscle spasms.   OSTEO BI-FLEX TRIPLE STRENGTH Tabs Take 2 tablets by mouth daily.   oxyCODONE 5 MG immediate release tablet Commonly known as:  Oxy IR/ROXICODONE Take 1-2 tablets (5-10 mg total) by mouth every 4 (four) hours as needed for moderate pain (  pain score 4-6).   torsemide 20 MG tablet Commonly known as:  DEMADEX Take 10-20 mg by mouth daily as needed (swelling).            Durable Medical Equipment  (From admission, onward)         Start     Ordered   09/30/17 1745  DME 3 n 1  Once     09/30/17 1744   09/30/17 1745  DME Walker rolling  Once    Question:  Patient needs a walker to treat with the following condition  Answer:  Status post total knee replacement, right   09/30/17 1744          Diagnostic Studies: Dg Knee Right Port  Result Date: 09/30/2017 CLINICAL DATA:  Status post total knee replacement on the right EXAM: PORTABLE RIGHT KNEE - 1-2 VIEW COMPARISON:  None. FINDINGS: Fresh total knee arthroplasty  that is well seated and located. No periprosthetic fracture. IMPRESSION: No acute finding after total knee arthroplasty. Electronically Signed   By: Marnee SpringJonathon  Watts M.D.   On: 09/30/2017 16:48    Disposition: Discharge disposition: 03-Skilled Nursing Facility         Follow-up Information    Kathryne HitchBlackman, Christopher Y, MD Follow up in 2 week(s).   Specialty:  Orthopedic Surgery Contact information: [address] [place] [address] [phone]            Signed: Kathryne HitchChristopher Y Blackman 10/03/2017, 6:46 AM

## 2017-10-03 NOTE — Plan of Care (Signed)
  Problem: Nutrition: Goal: Adequate nutrition will be maintained Outcome: Progressing   Problem: Pain Managment: Goal: General experience of comfort will improve Outcome: Progressing   Problem: Elimination: Goal: Will not experience complications related to bowel motility Outcome: Progressing   Problem: Activity: Goal: Risk for activity intolerance will decrease Outcome: Progressing   Problem: Safety: Goal: Ability to remain free from injury will improve Outcome: Progressing   Problem: Skin Integrity: Goal: Risk for impaired skin integrity will decrease Outcome: Progressing

## 2017-10-03 NOTE — Progress Notes (Signed)
Physical Therapy Treatment Patient Details Name: Bonnie [name] MRN: [id number] DOB: Oct 12, 1940 Today's Date: 10/03/2017    History of Present Illness Pt is a 77 y/o female s/p elective R TKA. PMH including but not limited to arthritis and hypothyroidism.    PT Comments    Pt continuing to make slow, steady progress with functional mobility. She remains limited secondary to pain, weakness and fatigue. Pt would continue to benefit from skilled physical therapy services at this time while admitted and after d/c to address the below listed limitations in order to improve overall safety and independence with functional mobility.    Follow Up Recommendations  SNF     Equipment Recommendations  None recommended by PT    Recommendations for Other Services       Precautions / Restrictions Precautions Precautions: Fall;Knee Restrictions Weight Bearing Restrictions: Yes RLE Weight Bearing: Weight bearing as tolerated    Mobility  Bed Mobility Overal bed mobility: Needs Assistance Bed Mobility: Supine to Sit     Supine to sit: Min assist     General bed mobility comments: min A for R LE movement  Transfers Overall transfer level: Needs assistance Equipment used: Rolling walker (2 wheeled) Transfers: Sit to/from Stand Sit to Stand: Min assist         General transfer comment: min A for stability and to power into standing from EOB x1 and from toilet x1. pt required frequent cueing as she attempted to prematurely sit, releasing the RW before being in a safe position to sit down  Ambulation/Gait Ambulation/Gait assistance: Min guard;Min assist Gait Distance (Feet): 50 Feet Assistive device: Rolling walker (2 wheeled) Gait Pattern/deviations: Step-to pattern;Decreased step length - right;Decreased step length - left;Decreased stance time - right;Decreased stride length;Decreased weight shift to right;Antalgic Gait velocity: decreased Gait velocity interpretation: <1.31  ft/sec, indicative of household ambulator General Gait Details: pt with slow, cautious and guarded gait pattern with unsteadiness using RW, constant min guard for safety and intermittent min A for stability and safety, cueing for sequencing   Stairs             Wheelchair Mobility    Modified Rankin (Stroke Patients Only)       Balance Overall balance assessment: Needs assistance Sitting-balance support: Feet supported Sitting balance-Leahy Scale: Good     Standing balance support: During functional activity;Single extremity supported;Bilateral upper extremity supported Standing balance-Leahy Scale: Poor                              Cognition Arousal/Alertness: Lethargic Behavior During Therapy: WFL for tasks assessed/performed;Flat affect Overall Cognitive Status: Within Functional Limits for tasks assessed                                        Exercises Total Joint Exercises Long Arc Quad: AAROM;Right;10 reps;Seated Knee Flexion: AAROM;Right;10 reps;Seated    General Comments        Pertinent Vitals/Pain Pain Assessment: 0-10 Pain Score: 3  Pain Location: R knee Pain Descriptors / Indicators: Sore Pain Intervention(s): Monitored during session;Repositioned    Home Living                      Prior Function            PT Goals (current goals can now be found in the care plan section)  Acute Rehab PT Goals PT Goal Formulation: With patient Time For Goal Achievement: 10/15/17 Potential to Achieve Goals: Good Progress towards PT goals: Progressing toward goals    Frequency    7X/week      PT Plan Current plan remains appropriate    Co-evaluation              AM-PAC PT "6 Clicks" Daily Activity  Outcome Measure  Difficulty turning over in bed (including adjusting bedclothes, sheets and blankets)?: Unable Difficulty moving from lying on back to sitting on the side of the bed? : Unable Difficulty  sitting down on and standing up from a chair with arms (e.g., wheelchair, bedside commode, etc,.)?: Unable Help needed moving to and from a bed to chair (including a wheelchair)?: A Little Help needed walking in hospital room?: A Little Help needed climbing 3-5 steps with a railing? : A Lot 6 Click Score: 11    End of Session Equipment Utilized During Treatment: Gait belt Activity Tolerance: Patient tolerated treatment well Patient left: in chair;with call bell/phone within reach Nurse Communication: Mobility status PT Visit Diagnosis: Other abnormalities of gait and mobility (R26.89);Pain Pain - Right/Left: Right Pain - part of body: Knee     Time: [id number] PT Time Calculation (min) (ACUTE ONLY): 20 min  Charges:  $Gait Training: 8-22 mins                     Deborah ChalkJennifer Jasmyne [name], [place], DPT  Acute Rehabilitation Services Pager [phone] Office [phone]     Bonnie BevelsJennifer [name] Bonnie [name] 10/03/2017, 10:22 AM

## 2017-10-03 NOTE — Plan of Care (Signed)
  Problem: Pain Managment: Goal: General experience of comfort will improve Outcome: Progressing   Problem: Skin Integrity: Goal: Risk for impaired skin integrity will decrease Outcome: Progressing   

## 2017-10-03 NOTE — Progress Notes (Signed)
Provided discharge education/instruction, all questions and concerns addressed, Pt discharged to facility accompanied by daughter.

## 2017-10-09 ENCOUNTER — Telehealth (INDEPENDENT_AMBULATORY_CARE_PROVIDER_SITE_OTHER): Payer: Self-pay | Admitting: Orthopaedic Surgery

## 2017-10-09 NOTE — Telephone Encounter (Signed)
Alisha, wound care nurse at [facility] needs to know if she needs to remove or change any dressing prior to the patient's 2 week post op appointment.  Her CB#[phone] ext. [phone].  Thank you.

## 2017-10-09 NOTE — Telephone Encounter (Signed)
Aware that she is to keep the dressing on until her post op appointment

## 2017-10-14 ENCOUNTER — Encounter (INDEPENDENT_AMBULATORY_CARE_PROVIDER_SITE_OTHER): Payer: Self-pay | Admitting: Orthopaedic Surgery

## 2017-10-14 ENCOUNTER — Ambulatory Visit (INDEPENDENT_AMBULATORY_CARE_PROVIDER_SITE_OTHER): Payer: Medicare Other | Admitting: Orthopaedic Surgery

## 2017-10-14 DIAGNOSIS — Z96651 Presence of right artificial knee joint: Secondary | ICD-10-CM

## 2017-10-14 NOTE — Progress Notes (Signed)
Bonnie [name] returns today 2 weeks status post right total knee arthroplasty.  She is overall doing well.  Has no complaints.  She is at [employer] in [place] and feels like she is making good progress.  She denies any chest pain shortness breath fevers chills.  Physical exam: General well-developed well-nourished female no acute distress mood affect appropriate Right knee full extension flexion to 90 degrees calf supple nontender.  Surgical incisions well approximated staples no signs of infection.  Impression: Status post right total knee arthroplasty 09/30/2017  Plan: She is given a prescription for outpatient therapy to start once she is able to drive.  She may benefit from home health therapy in the interim due to the fact she lives alone.  She will remain on aspirin once daily for another week and then discontinue as she was on no aspirin prior to surgery.  Staples removed Steri-Strips applied.  Follow-up in 1 month sooner if there is any questions or concerns.

## 2017-10-20 ENCOUNTER — Telehealth (INDEPENDENT_AMBULATORY_CARE_PROVIDER_SITE_OTHER): Payer: Self-pay | Admitting: *Deleted

## 2017-10-20 NOTE — Telephone Encounter (Signed)
Mark with [facility] wants to get [facility]1 [facility] and 2x/1 week starting 10/20/17.  Please return call to [phone]

## 2017-10-20 NOTE — Telephone Encounter (Signed)
Verbal order given on VM 

## 2017-10-30 ENCOUNTER — Telehealth (INDEPENDENT_AMBULATORY_CARE_PROVIDER_SITE_OTHER): Payer: Self-pay | Admitting: Orthopaedic Surgery

## 2017-10-30 NOTE — Telephone Encounter (Signed)
Faxed

## 2017-10-30 NOTE — Telephone Encounter (Signed)
[facility] is requesting demographics and insurance cards be faxed to them # [phone]

## 2017-11-11 ENCOUNTER — Ambulatory Visit (INDEPENDENT_AMBULATORY_CARE_PROVIDER_SITE_OTHER): Payer: Medicare Other | Admitting: Orthopaedic Surgery

## 2017-11-11 ENCOUNTER — Encounter (INDEPENDENT_AMBULATORY_CARE_PROVIDER_SITE_OTHER): Payer: Self-pay | Admitting: Orthopaedic Surgery

## 2017-11-11 DIAGNOSIS — Z96651 Presence of right artificial knee joint: Secondary | ICD-10-CM

## 2017-11-11 NOTE — Progress Notes (Signed)
The patient is now between 6 and 7 weeks status post a right total knee arthroplasty.  She is doing well overall and still going to therapy.  She does not need pain medicine she states.  She has excellent range of motion and strength.  She says that therapy would like to work with her about 2 more weeks I think is reasonable.  On exam her right knee moves well.  It is ligamentously stable and range of motion is almost full.  The incision is well-healed.  Her calf is soft.  This point she is cleared to drive.  She can back to all activities as she tolerates.  I will be in 3 months with a repeat AP and lateral of the right knee.  All question concerns were answered and addressed.

## 2018-01-19 ENCOUNTER — Telehealth (INDEPENDENT_AMBULATORY_CARE_PROVIDER_SITE_OTHER): Payer: Self-pay | Admitting: Orthopaedic Surgery

## 2018-01-19 NOTE — Telephone Encounter (Signed)
Now that she is over 3 months out from her joint replacement, she does not need antibiotic for a routine cleaning from my standpoint.

## 2018-01-19 NOTE — Telephone Encounter (Signed)
Patient called stating that she has a dentist appointment next Monday, December 23rd and needs an antibiotic called into [facility] in [place].  CB#[phone].  Thank you

## 2018-01-19 NOTE — Telephone Encounter (Signed)
LMOM notifying patient.

## 2018-01-19 NOTE — Telephone Encounter (Signed)
Please advise 

## 2018-02-11 ENCOUNTER — Ambulatory Visit (INDEPENDENT_AMBULATORY_CARE_PROVIDER_SITE_OTHER): Payer: Medicare Other | Admitting: Orthopaedic Surgery

## 2018-02-11 ENCOUNTER — Encounter (INDEPENDENT_AMBULATORY_CARE_PROVIDER_SITE_OTHER): Payer: Self-pay | Admitting: Orthopaedic Surgery

## 2018-02-11 ENCOUNTER — Ambulatory Visit (INDEPENDENT_AMBULATORY_CARE_PROVIDER_SITE_OTHER): Payer: Medicare Other

## 2018-02-11 DIAGNOSIS — Z96651 Presence of right artificial knee joint: Secondary | ICD-10-CM | POA: Diagnosis not present

## 2018-02-11 DIAGNOSIS — M1712 Unilateral primary osteoarthritis, left knee: Secondary | ICD-10-CM

## 2018-02-11 NOTE — Progress Notes (Signed)
Office Visit Note   Patient: Bonnie [name]           Date of Birth: [birth year]           MRN: [id number] Visit Date: 02/11/2018              Requested by: Jamal CollinHedgecock, Suzanne, PA-C [address] [place], [place] [address] PCP: Jamal CollinHedgecock, Suzanne, PA-C   Assessment & Plan: Visit Diagnoses:  1. History of total right knee replacement   2. Unilateral primary osteoarthritis, left knee     Plan:   Follow-Up Instructions: Return in about 6 months (around 08/12/2018).   Orders:  Orders Placed This Encounter  Procedures  . XR Knee 1-2 Views Right   No orders of the defined types were placed in this encounter.     Procedures: No procedures performed   Clinical Data: No additional findings.   Subjective: Chief Complaint  Patient presents with  . Right Knee - Follow-up    HPI 41-year where fingers Mrs. Bonnie [name] returns today follow-up 4 months status post right total knee arthroplasty.  She is overall doing well in regards to the right knee.  She states she is very happy with the results.  She is having pain in her left knee is requesting a cortisone injection in the knee today.  She is done well with previous cortisone in the right knee prior to surgery in the left knee prior to surgery.  Review of Systems Denies any fevers chills shortness of breath chest pain  Objective: Vital Signs: There were no vitals taken for this visit.  Physical Exam Constitutional:      Appearance: She is not ill-appearing or diaphoretic.  Pulmonary:     Effort: Pulmonary effort is normal.  Neurological:     Mental Status: She is alert and oriented to person, place, and time.     Ortho Exam Right knee full extension flexion to 110 115 degrees.  No instability valgus varus stressing.  Right calf supple nontender.  Surgical incisions well-healed.  Left knee tenderness medial joint line.  No abnormal warmth erythema or effusion.  Good range of motion of the knee. Specialty  Comments:  No specialty comments available.  Imaging: Xr Knee 1-2 Views Right  Result Date: 02/11/2018 Right knee AP and lateral view: Shows well-seated right total knee arthroplasty without any evidence of complications.  The AP view does show the left knee and this shows bone-on-bone medial compartment.     PMFS History: Patient Active Problem List   Diagnosis Date Noted  . 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 Unilateral primary osteoarthritis, right knee 08/19/2017   Past Medical History:  Diagnosis Date  . Anxiety   . Arthritis   . Hypothyroidism   . Neutropenia (HCC)    had this in the past, resolve in 2014  . Sleep apnea    "mild"-does not use cpap    No family history on file.  Past Surgical History:  Procedure Laterality Date  . ABDOMINAL HYSTERECTOMY    . CHOLECYSTECTOMY    . TOTAL KNEE ARTHROPLASTY Right 09/30/2017   Procedure: RIGHT TOTAL KNEE ARTHROPLASTY;  Surgeon: Kathryne HitchBlackman, Christopher Y, MD;  [facility];  Service: Orthopedics;  Laterality: Right;   Social History   Occupational History  . Not on file  Tobacco Use  . Smoking status: Never Smoker  . Smokeless tobacco: Never Used  Substance and Sexual Activity  . Alcohol use: Not Currently  . Drug  use: Not Currently  . Sexual activity: Not on file

## 2018-08-12 ENCOUNTER — Ambulatory Visit (INDEPENDENT_AMBULATORY_CARE_PROVIDER_SITE_OTHER): Payer: Medicare Other | Admitting: Orthopaedic Surgery

## 2018-08-12 ENCOUNTER — Other Ambulatory Visit: Payer: Self-pay

## 2018-08-12 ENCOUNTER — Encounter: Payer: Self-pay | Admitting: Orthopaedic Surgery

## 2018-08-12 DIAGNOSIS — Z96651 Presence of right artificial knee joint: Secondary | ICD-10-CM | POA: Diagnosis not present

## 2018-08-12 NOTE — Progress Notes (Signed)
The patient is a 78 year old active female who is now 10 months status post a right total knee arthroplasty.  She says the knee is doing well.  She says she does have stiffness in her body when she is first getting up and getting to walk but no issues with her knee.  She denies any locking catching or symptoms of instability.  She denies any swelling.  On examination of her right operative knee the incisions healed nicely.  There is no effusion.  She has full extension and full flexion.  The knee feels ligamentously stable.  We did not x-ray today.  Since she is doing so well, follow-up can be as needed.  We talked about the things that we need to bring her back in for this knee if she is having any symptoms of swelling or pain at all.  All question concerns were answered and addressed.

## 2019-04-12 IMAGING — DX DG KNEE 1-2V PORT*R*
1 series · 2 of 2 positions shown · non-contrast
Comparison: None.

CLINICAL DATA: Status post total knee replacement on the right

EXAM:
PORTABLE RIGHT KNEE - 1-2 VIEW

[Series 1: knee · 0.14mm/px · 2 of 2 slices shown]
[im 1/2]
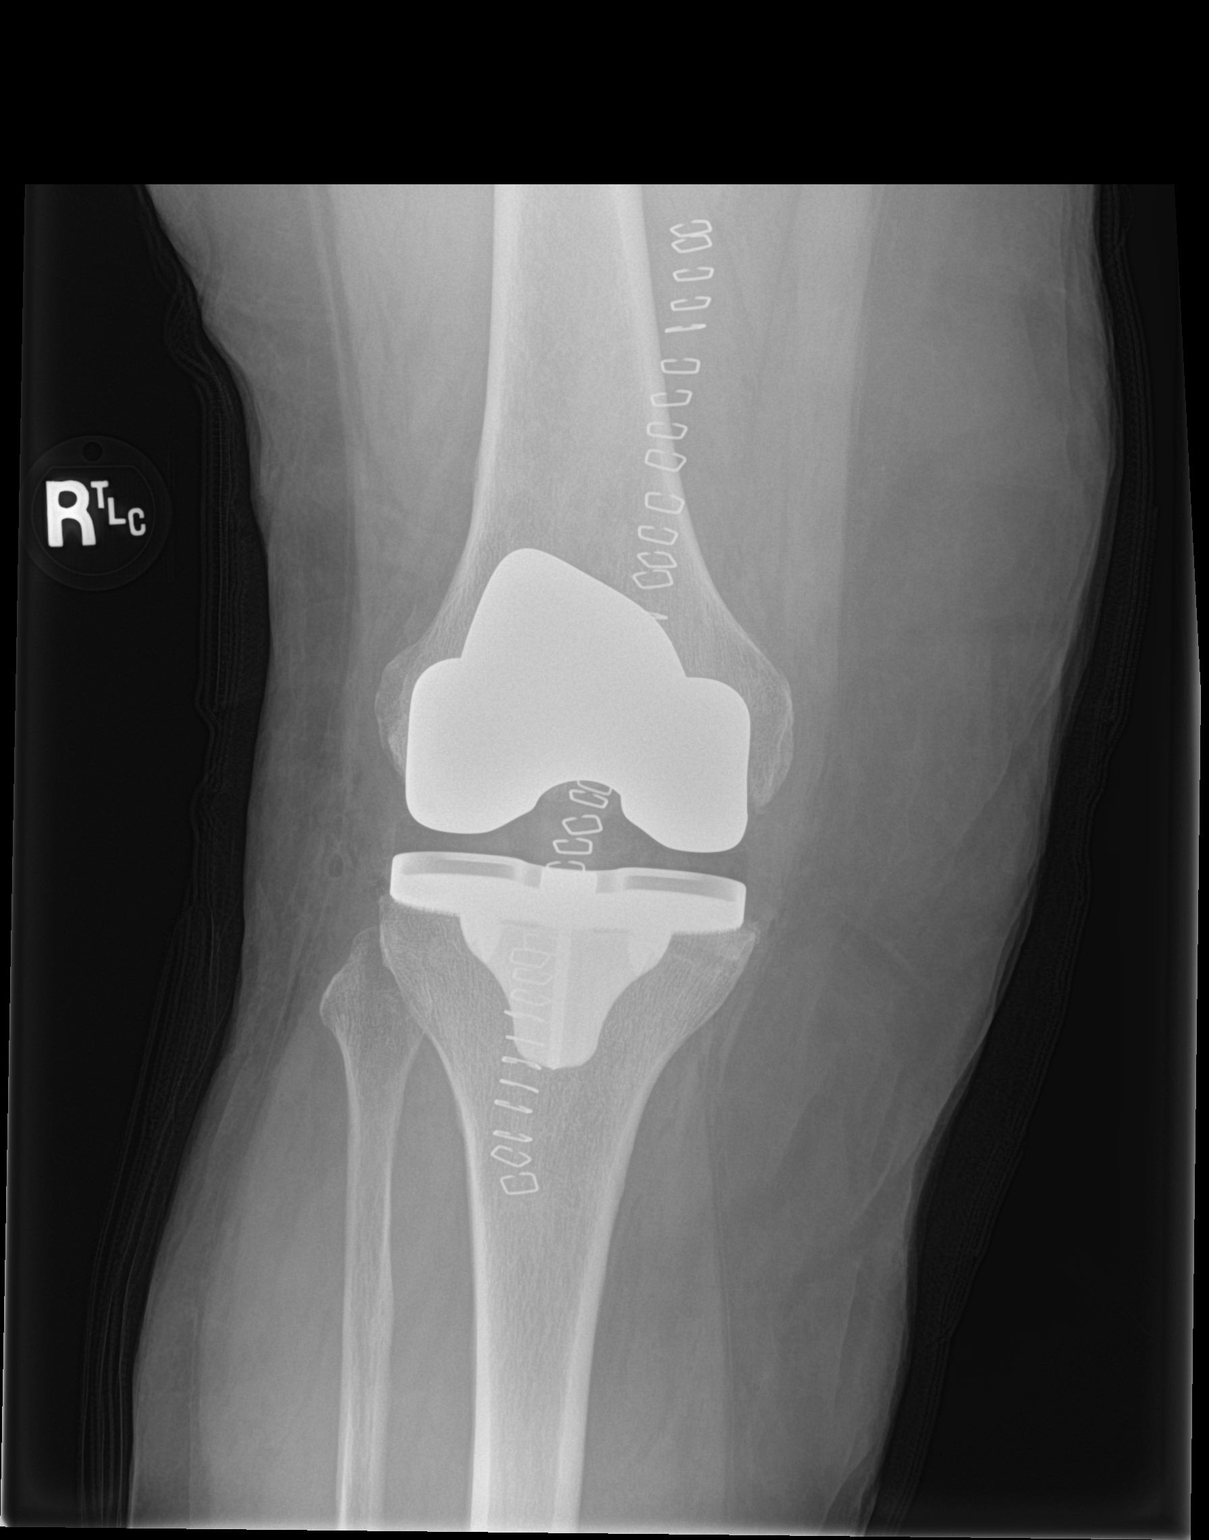
[im 2/2]
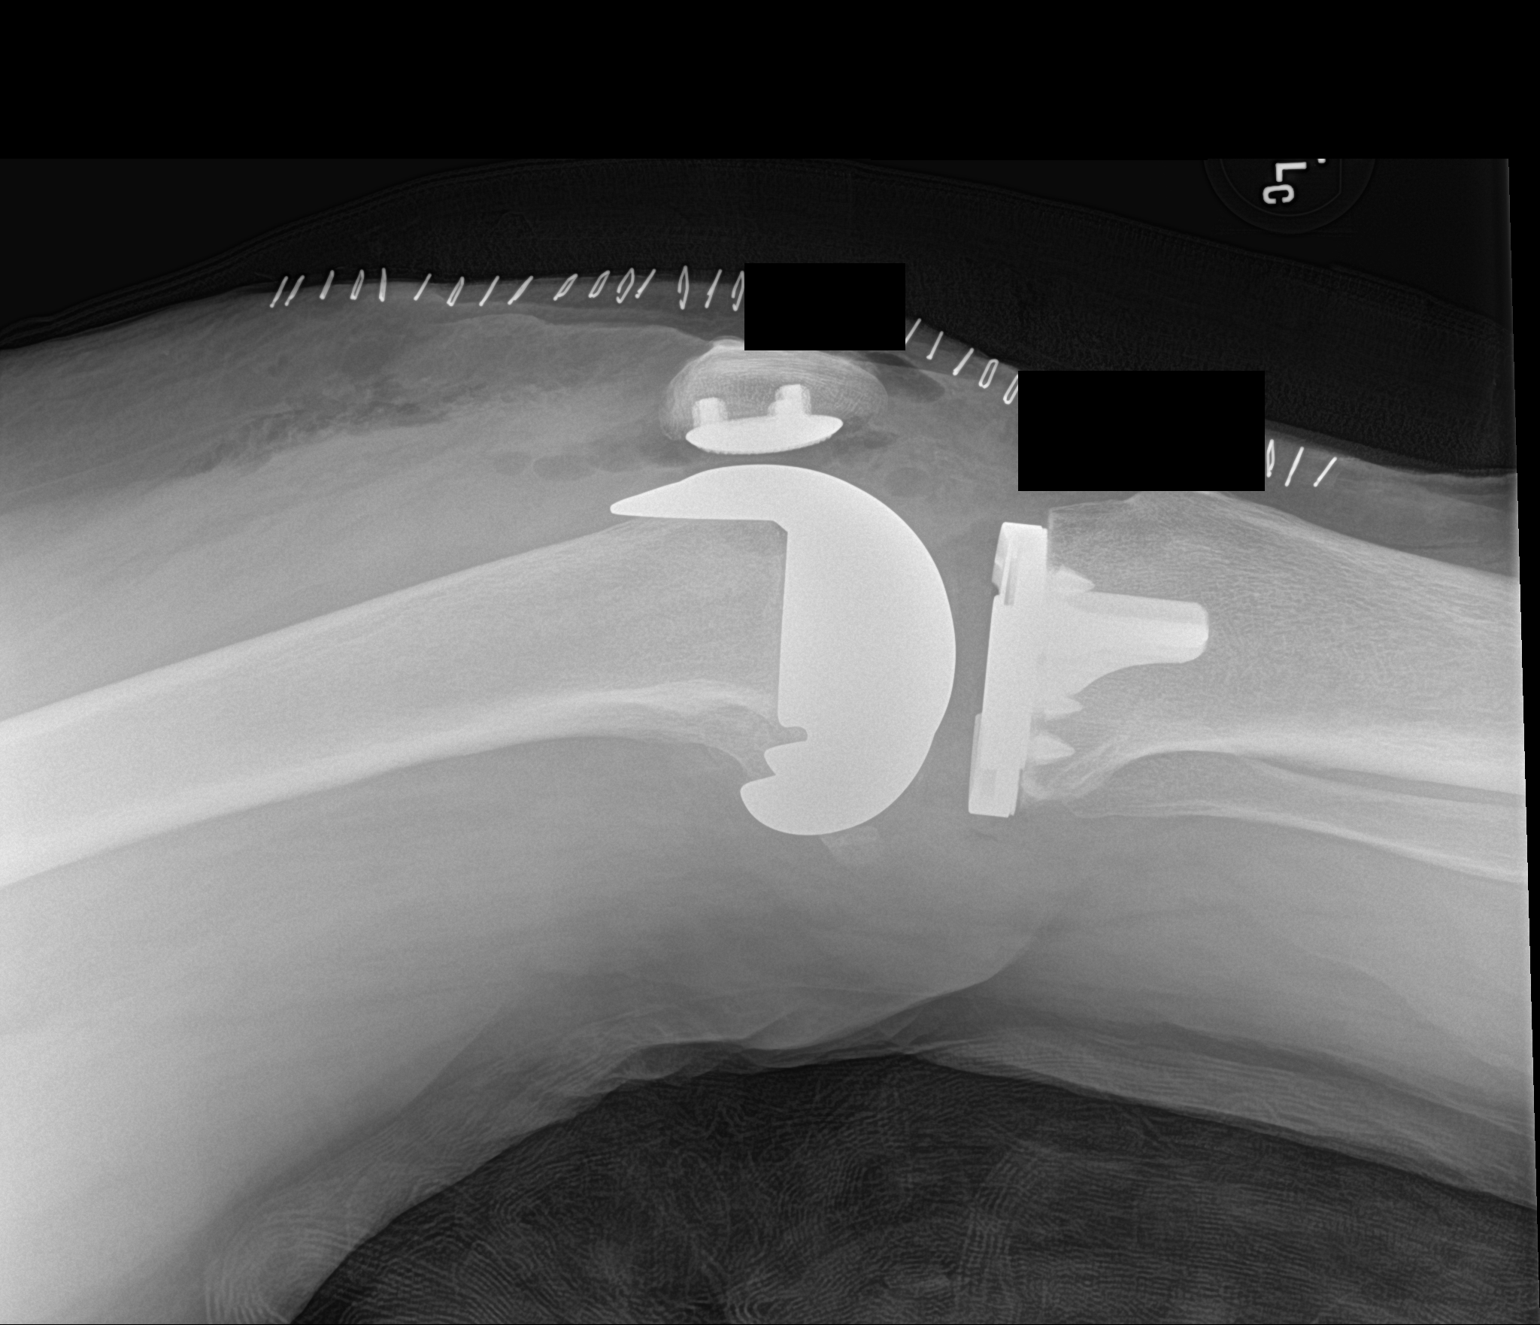

[2 of 2 positions shown; findings below may reference images not displayed]

FINDINGS: Fresh total knee arthroplasty that is well seated and located. No
periprosthetic fracture.
IMPRESSION: No acute finding after total knee arthroplasty.

## 2019-04-26 ENCOUNTER — Encounter: Payer: Self-pay | Admitting: Physician Assistant

## 2019-04-26 ENCOUNTER — Other Ambulatory Visit: Payer: Self-pay

## 2019-04-26 ENCOUNTER — Ambulatory Visit (INDEPENDENT_AMBULATORY_CARE_PROVIDER_SITE_OTHER): Payer: Medicare Other | Admitting: Physician Assistant

## 2019-04-26 ENCOUNTER — Ambulatory Visit (INDEPENDENT_AMBULATORY_CARE_PROVIDER_SITE_OTHER): Payer: Medicare Other

## 2019-04-26 DIAGNOSIS — M25562 Pain in left knee: Secondary | ICD-10-CM

## 2019-04-26 MED ORDER — METHYLPREDNISOLONE ACETATE 40 MG/ML IJ SUSP
40.0000 mg | INTRAMUSCULAR | Status: AC | PRN
  Administered 2019-04-26: 40 mg via INTRA_ARTICULAR

## 2019-04-26 MED ORDER — LIDOCAINE HCL 1 % IJ SOLN
3.0000 mL | INTRAMUSCULAR | Status: AC | PRN
  Administered 2019-04-26: 3 mL

## 2019-04-26 NOTE — Progress Notes (Signed)
Office Visit Note   Patient: Bonnie [name]           Date of Birth: Jan 02, 1941           MRN: [id number] Visit Date: 04/26/2019              Requested by: Bonnie Collin, PA-C [address] [place],  [place] [address] PCP: Bonnie Collin, PA-C   Assessment & Plan: Visit Diagnoses:  1. Left knee pain, unspecified chronicity     Plan: She understands to wait at least 3 months between cortisone injections.  Follow-up with [place] on as-needed basis.  Questions encouraged and answered.  Follow-Up Instructions: Return if symptoms worsen or fail to improve.   Orders:  Orders Placed This Encounter  Procedures  . Large Joint Inj: L knee  . XR Knee 1-2 Views Left   No orders of the defined types were placed in this encounter.     Procedures: Large Joint Inj: L knee on 04/26/2019 5:25 PM Indications: pain Details: 22 G 1.5 in needle, anterolateral approach  Arthrogram: No  Medications: 3 mL lidocaine 1 %; 40 mg methylPREDNISolone acetate 40 MG/ML Outcome: tolerated well, no immediate complications Procedure, treatment alternatives, risks and benefits explained, specific risks discussed. Consent was given by the patient. Immediately prior to procedure a time out was called to verify the correct patient, procedure, equipment, support staff and site/side marked as required. Patient was prepped and draped in the usual sterile fashion.       Clinical Data: No additional findings.   Subjective: Chief Complaint  Patient presents with  . Left Knee - Pain    HPI Bonnie [name] is well-known to Dr. Magnus Ivan service comes in today due to left knee pain for the past month.  Pain began mostly after doing a lot of yard work.  Pain is anterior aspect the knee.  She has had no known injury.  She does have known arthritis of her knee.  She has had a right knee replacement which is doing well.  Currently she is taking Tylenol arthritis and Advil for the pain in the left knee.   She has 2 trips planned later this summer and wants to injection in the left knee. Review of Systems Negative for fevers chills shortness of breath chest pain  Objective: Vital Signs: There were no vitals taken for this visit.  Physical Exam Constitutional:      Appearance: She is not ill-appearing or diaphoretic.  Pulmonary:     Effort: Pulmonary effort is normal.  Neurological:     Mental Status: She is alert and oriented to person, place, and time.  Psychiatric:        Mood and Affect: Mood normal.     Ortho Exam Left knee good range of motion without pain.  No instability valgus varus stressing.  No effusion abnormal warmth erythema left knee. Specialty Comments:  No specialty comments available.  Imaging: XR Knee 1-2 Views Left  Result Date: 04/26/2019 Left knee AP lateral views: No acute fracture.  Bone-on-bone medial compartment.  Moderate to severe patellofemoral changes.  Periarticular spurs off the lateral compartment.  Knee is well located.    PMFS History: Patient Active Problem List   Diagnosis Date Noted  . 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 Unilateral primary osteoarthritis, right knee 08/19/2017   Past Medical History:  Diagnosis Date  . Anxiety   . Arthritis   . Hypothyroidism   . Neutropenia (HCC)  had this in the past, resolve in 2014  . Sleep apnea    "mild"-does not use cpap    History reviewed. No pertinent family history.  Past Surgical History:  Procedure Laterality Date  . ABDOMINAL HYSTERECTOMY    . CHOLECYSTECTOMY    . TOTAL KNEE ARTHROPLASTY Right 09/30/2017   Procedure: RIGHT TOTAL KNEE ARTHROPLASTY;  Surgeon: Mcarthur Rossetti, MD;  Location: [facility];  Service: Orthopedics;  Laterality: Right;   Social History   Occupational History  . Not on file  Tobacco Use  . Smoking status: Never Smoker  . Smokeless tobacco: Never Used  Substance and Sexual Activity  . Alcohol use: Not Currently  . Drug use:  Not Currently  . Sexual activity: Not on file

## 2020-03-21 ENCOUNTER — Encounter: Payer: Self-pay | Admitting: Orthopaedic Surgery

## 2020-03-21 ENCOUNTER — Ambulatory Visit (INDEPENDENT_AMBULATORY_CARE_PROVIDER_SITE_OTHER): Payer: Medicare Other | Admitting: Orthopaedic Surgery

## 2020-03-21 DIAGNOSIS — M25562 Pain in left knee: Secondary | ICD-10-CM

## 2020-03-21 DIAGNOSIS — G8929 Other chronic pain: Secondary | ICD-10-CM

## 2020-03-21 MED ORDER — LIDOCAINE HCL 1 % IJ SOLN
3.0000 mL | INTRAMUSCULAR | Status: AC | PRN
  Administered 2020-03-21: 3 mL

## 2020-03-21 MED ORDER — METHYLPREDNISOLONE ACETATE 40 MG/ML IJ SUSP
40.0000 mg | INTRAMUSCULAR | Status: AC | PRN
  Administered 2020-03-21: 40 mg via INTRA_ARTICULAR

## 2020-03-21 NOTE — Progress Notes (Signed)
Office Visit Note   Patient: Bonnie [name]           Date of Birth: [birth year]           MRN: [id number] Visit Date: 03/21/2020              Requested by: Jamal Collin, PA-C [address] [place],  [place] [address] PCP: Jamal Collin, PA-C   Assessment & Plan: Visit Diagnoses:  1. Chronic pain of left knee     Plan: Per the patient's request I did place a steroid injection in her left knee today since its been 11 months since her last steroid injection. We talked about knee replacement surgery and if she gets to the point where her pain is daily and definitely affecting her mobility and her actives daily living as well as her quality of life she would like to proceed with this on her left knee. Thus far, since injections are helping recommended continued conservative treatment route. She will still work on quad strengthening exercises as well.  Follow-Up Instructions: Return if symptoms worsen or fail to improve.   Orders:  Orders Placed This Encounter  Procedures  . Large Joint Inj   No orders of the defined types were placed in this encounter.     Procedures: Large Joint Inj: L knee on 03/21/2020 10:25 AM Indications: diagnostic evaluation and pain Details: 22 G 1.5 in needle, superolateral approach  Arthrogram: No  Medications: 3 mL lidocaine 1 %; 40 mg methylPREDNISolone acetate 40 MG/ML Outcome: tolerated well, no immediate complications Procedure, treatment alternatives, risks and benefits explained, specific risks discussed. Consent was given by the patient. Immediately prior to procedure a time out was called to verify the correct patient, procedure, equipment, support staff and site/side marked as required. Patient was prepped and draped in the usual sterile fashion.       Clinical Data: No additional findings.   Subjective: Chief Complaint  Patient presents with  . Left Knee - Pain  The patient is a 80 year old female well-known  to [place]. We replaced her right knee I believe in 2019. We last saw her for her left knee in March of last year. Even x-rays then showed significant arthritis in the left knee. She has not had a steroid injection in that knee for 11 months. She comes in today with medial joint line tenderness and hoping to have an injection in her knee again. She is very active. She denies any acute change in her medical status.  HPI  Review of Systems She currently denies any headache, chest pain, shortness of breath, fever, chills, nausea, vomiting  Objective: Vital Signs: There were no vitals taken for this visit.  Physical Exam She is alert and orient x3 and in no acute distress Ortho Exam Examination of her left knee shows medial joint line tenderness. She has varus malalignment that is correctable. She has good range of motion of that knee but is painful. She has patellofemoral crepitation. Specialty Comments:  No specialty comments available.  Imaging: No results found. I did review her x-rays from last year of that left knee and showed them to her. It does show severe arthritis of the left knee especially involving the medial compartment.  PMFS History: Patient Active Problem List   Diagnosis Date Noted  . 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 Unilateral primary osteoarthritis, right knee 08/19/2017   Past Medical History:  Diagnosis Date  . Anxiety   . Arthritis   .  Hypothyroidism   . Neutropenia (HCC)    had this in the past, resolve in 2014  . Sleep apnea    "mild"-does not use cpap    History reviewed. No pertinent family history.  Past Surgical History:  Procedure Laterality Date  . ABDOMINAL HYSTERECTOMY    . CHOLECYSTECTOMY    . TOTAL KNEE ARTHROPLASTY Right 09/30/2017   Procedure: RIGHT TOTAL KNEE ARTHROPLASTY;  Surgeon: Kathryne Hitch, MD;  Location: [name] OR;  Service: Orthopedics;  Laterality: Right;   Social History   Occupational History  . Not  on file  Tobacco Use  . Smoking status: Never Smoker  . Smokeless tobacco: Never Used  Vaping Use  . Vaping Use: Never used  Substance and Sexual Activity  . Alcohol use: Not Currently  . Drug use: Not Currently  . Sexual activity: Not on file

## 2020-10-02 ENCOUNTER — Ambulatory Visit (INDEPENDENT_AMBULATORY_CARE_PROVIDER_SITE_OTHER): Payer: Medicare Other | Admitting: Orthopaedic Surgery

## 2020-10-02 ENCOUNTER — Encounter: Payer: Self-pay | Admitting: Orthopaedic Surgery

## 2020-10-02 DIAGNOSIS — M25562 Pain in left knee: Secondary | ICD-10-CM | POA: Diagnosis not present

## 2020-10-02 DIAGNOSIS — G8929 Other chronic pain: Secondary | ICD-10-CM

## 2020-10-02 MED ORDER — METHYLPREDNISOLONE ACETATE 40 MG/ML IJ SUSP
40.0000 mg | INTRAMUSCULAR | Status: AC | PRN
  Administered 2020-10-02: 40 mg via INTRA_ARTICULAR

## 2020-10-02 MED ORDER — LIDOCAINE HCL 1 % IJ SOLN
3.0000 mL | INTRAMUSCULAR | Status: AC | PRN
  Administered 2020-10-02: 3 mL

## 2020-10-02 NOTE — Progress Notes (Signed)
Office Visit Note   Patient: Bonnie [name]           Date of Birth: [birth year]           MRN: [id number] Visit Date: 10/02/2020              Requested by: Jamal Collin, PA-C [address] [place],  [place] [address] PCP: Jamal Collin, PA-C   Assessment & Plan: Visit Diagnoses:  1. Chronic pain of left knee     Plan: Per her request I did place a steroid injection in her left knee today.  She understands the risks and benefits of these types of injections.  I would like to see her back in 2 months because at that point we can work on getting her scheduled for surgery in January.  I would also like a repeat 2 views of the left knee at that visit.  Follow-Up Instructions: Return in about 2 months (around 12/02/2020).   Orders:  Orders Placed This Encounter  Procedures   Large Joint Inj   No orders of the defined types were placed in this encounter.     Procedures: Large Joint Inj: L knee on 10/02/2020 9:34 AM Indications: diagnostic evaluation and pain Details: 22 G 1.5 in needle, superolateral approach  Arthrogram: No  Medications: 3 mL lidocaine 1 %; 40 mg methylPREDNISolone acetate 40 MG/ML Outcome: tolerated well, no immediate complications Procedure, treatment alternatives, risks and benefits explained, specific risks discussed. Consent was given by the patient. Immediately prior to procedure a time out was called to verify the correct patient, procedure, equipment, support staff and site/side marked as required. Patient was prepped and draped in the usual sterile fashion.      Clinical Data: No additional findings.   Subjective: Chief Complaint  Patient presents with   Left Knee - Follow-up  The patient is well-known to me.  We replaced her right knee several years ago.  Her left knee has well-documented severe arthritis and it and steroid injections help temporize her pain.  We last placed a steroid injection in February in the left  knee.  She is requesting 1 today.  She had been on a recent trip doing a lot of walking and now her left left knee hurts her quite a bit.  It has been 6 months since her last steroid injection.  At this point she is interested in considering a knee replacement in January of next year.  She denies any other acute change in her medical status.  HPI  Review of Systems There is currently listed no headache, chest pain, shortness of breath, fever, chills, nausea, vomiting  Objective: Vital Signs: There were no vitals taken for this visit.  Physical Exam She is alert and orient x3 and in no acute distress Ortho Exam Examination of her left knee shows medial and lateral joint line tenderness and patellofemoral tenderness with good range of motion and slight varus malalignment.  He flexes and extends well. Specialty Comments:  No specialty comments available.  Imaging: No results found.   PMFS History: Patient Active Problem List   Diagnosis Date Noted   Status post total knee replacement, right 09/30/2017   Unilateral primary osteoarthritis, right knee 08/19/2017   Past Medical History:  Diagnosis Date   Anxiety    Arthritis    Hypothyroidism    Neutropenia (HCC)    had this in the past, resolve in 2014   Sleep apnea    "mild"-does not use  cpap    History reviewed. No pertinent family history.  Past Surgical History:  Procedure Laterality Date   ABDOMINAL HYSTERECTOMY     CHOLECYSTECTOMY     TOTAL KNEE ARTHROPLASTY Right 09/30/2017   Procedure: RIGHT TOTAL KNEE ARTHROPLASTY;  Surgeon: Kathryne Hitch, MD;  Location: [facility] OR;  Service: Orthopedics;  Laterality: Right;   Social History   Occupational History   Not on file  Tobacco Use   Smoking status: Never   Smokeless tobacco: Never  Vaping Use   Vaping Use: Never used  Substance and Sexual Activity   Alcohol use: Not Currently   Drug use: Not Currently   Sexual activity: Not on file

## 2020-10-24 ENCOUNTER — Encounter: Payer: Self-pay | Admitting: Orthopaedic Surgery

## 2020-10-24 ENCOUNTER — Ambulatory Visit (INDEPENDENT_AMBULATORY_CARE_PROVIDER_SITE_OTHER): Payer: Medicare Other | Admitting: Orthopaedic Surgery

## 2020-10-24 ENCOUNTER — Ambulatory Visit (INDEPENDENT_AMBULATORY_CARE_PROVIDER_SITE_OTHER): Payer: Medicare Other

## 2020-10-24 VITALS — Wt 209.0 lb

## 2020-10-24 DIAGNOSIS — G8929 Other chronic pain: Secondary | ICD-10-CM

## 2020-10-24 DIAGNOSIS — M25562 Pain in left knee: Secondary | ICD-10-CM

## 2020-10-24 DIAGNOSIS — M1712 Unilateral primary osteoarthritis, left knee: Secondary | ICD-10-CM

## 2020-10-24 NOTE — Progress Notes (Signed)
Office Visit Note   Patient: Bonnie [name]           Date of Birth: November 17, 1940           MRN: [id number] Visit Date: 10/24/2020              Requested by: Jamal Collin, PA-C [address] [place],  [place] [address] PCP: Jamal Collin, PA-C   Assessment & Plan: Visit Diagnoses:  1. Chronic pain of left knee   2. Unilateral primary osteoarthritis, left knee     Plan: Having had a knee replacement before on the right side she is fully aware of what is involved with knee replacement surgery.  We had a discussion again about the risk and benefits of surgery and talked about what to expect from the interoperative postoperative course.  All questions and concerns were answered and addressed.  We will see her on November 1 for her left knee replacement.  Follow-Up Instructions: Return for 2 weeks post-op.   Orders:  Orders Placed This Encounter  Procedures   XR KNEE 3 VIEW LEFT   No orders of the defined types were placed in this encounter.     Procedures: No procedures performed   Clinical Data: No additional findings.   Subjective: Chief Complaint  Patient presents with   Left Knee - Follow-up  The patient is here today having scheduled a left total knee arthroplasty to treat severe arthritis on November 1 of this year.  We actually replaced her right knee several years ago.  Then he has done well.  Her left knee has been having significant issues in terms of pain on the medial aspect and the patellofemoral joint.  She is already tried and failed conservative treatment for over a year including activity modification, weight loss, quad training exercises, anti-inflammatories and steroid injections in the.  At this point her left knee pain is daily and it is detrimentally affecting her mobility, her quality of life, and her actives daily living.  She does wish to proceed with a knee replacement and again we have this scheduled.  She denies any acute change  in medical status.  She is not a diabetic.  I was able to review her complete chart in epic.  HPI  Review of Systems There is currently listed no headache, chest pain, shortness of breath, fever, chills, nausea, vomiting  Objective: Vital Signs: Wt 209 lb (94.8 kg)   BMI 38.23 kg/m   Physical Exam She is alert and oriented x3 and in no acute distress Ortho Exam On exam she is a young appearing 68.  Her right operative knee shows full and fluid range of motion.  Her left knee which is the painful knee has varus malalignment, there is significant patellofemoral crepitation and quite severe medial joint line tenderness.  The varus malalignment is correctable.  The left knee is ligamentously stable but painful throughout this arc of motion. Specialty Comments:  No specialty comments available.  Imaging: XR KNEE 3 VIEW LEFT  Result Date: 10/24/2020 X-rays of the left knee show severe tricompartment arthritis with complete loss of medial joint space and patellofemoral joint.  There is varus malalignment and osteophytes in all 3 compartments.    PMFS History: Patient Active Problem List   Diagnosis Date Noted   Unilateral primary osteoarthritis, left knee 10/24/2020   Status post total knee replacement, right 09/30/2017   Unilateral primary osteoarthritis, right knee 08/19/2017   Past Medical History:  Diagnosis Date  Anxiety    Arthritis    Hypothyroidism    Neutropenia (HCC)    had this in the past, resolve in 2014   Sleep apnea    "mild"-does not use cpap    History reviewed. No pertinent family history.  Past Surgical History:  Procedure Laterality Date   ABDOMINAL HYSTERECTOMY     CHOLECYSTECTOMY     TOTAL KNEE ARTHROPLASTY Right 09/30/2017   Procedure: RIGHT TOTAL KNEE ARTHROPLASTY;  Surgeon: Kathryne Hitch, MD;  Location: [facility] OR;  Service: Orthopedics;  Laterality: Right;   Social History   Occupational History   Not on file  Tobacco Use   Smoking  status: Never   Smokeless tobacco: Never  Vaping Use   Vaping Use: Never used  Substance and Sexual Activity   Alcohol use: Not Currently   Drug use: Not Currently   Sexual activity: Not on file

## 2020-11-01 ENCOUNTER — Other Ambulatory Visit: Payer: Self-pay

## 2020-11-22 ENCOUNTER — Other Ambulatory Visit: Payer: Self-pay | Admitting: Physician Assistant

## 2020-11-22 DIAGNOSIS — M1712 Unilateral primary osteoarthritis, left knee: Secondary | ICD-10-CM

## 2020-11-30 NOTE — Pre-Procedure Instructions (Signed)
Surgical Instructions    Your procedure is scheduled on Tuesday, November 1st.  Report to [facility] Main Entrance "A" at 10:00 A.M., then check in with the Admitting office.  Call this number if you have problems the morning of surgery:  [phone]   If you have any questions prior to your surgery date call [phone]: Open Monday-Friday 8am-4pm    Remember:  Do not eat after midnight the night before your surgery  You may drink clear liquids until 09:00 AM the morning of your surgery.   Clear liquids allowed are: Water, Non-Citrus Juices (without pulp), Carbonated Beverages, Clear Tea, Black Coffee Only, and Gatorade    Patient Instructions  The night before surgery:  No food after midnight. ONLY clear liquids after midnight  The day of surgery (if you do NOT have diabetes):  Drink ONE (1) Pre-Surgery Clear Ensure by 09:00 AM the morning of surgery. Drink in one sitting. Do not sip.  This drink was given to you during your hospital  pre-op appointment visit.  Nothing else to drink after completing the  Pre-Surgery Clear Ensure.          If you have questions, please contact your surgeon's office.     Take these medicines the morning of surgery with A SIP OF WATER  levothyroxine (SYNTHROID, LEVOTHROID) omeprazole (PRILOSEC)     As of today, STOP taking any Aspirin (unless otherwise instructed by your surgeon) Aleve, Naproxen, Ibuprofen, Motrin, Advil, Goody's, BC's, all herbal medications, fish oil, and all vitamins.                     Do NOT Smoke (Tobacco/Vaping) or drink Alcohol 24 hours prior to your procedure.  If you use a CPAP at night, you may bring all equipment for your overnight stay.   Contacts, glasses, piercing's, hearing aid's, dentures or partials may not be worn into surgery, please bring cases for these belongings.    For patients admitted to the hospital, discharge time will be determined by your treatment team.   Patients discharged the  day of surgery will not be allowed to drive home, and someone needs to stay with them for 24 hours.  NO VISITORS WILL BE ALLOWED IN PRE-OP WHERE PATIENTS GET READY FOR SURGERY.  ONLY 1 SUPPORT PERSON MAY BE PRESENT IN THE WAITING ROOM WHILE YOU ARE IN SURGERY.  IF YOU ARE TO BE ADMITTED, ONCE YOU ARE IN YOUR ROOM YOU WILL BE ALLOWED TWO (2) VISITORS.  Minor children may have two parents present. Special consideration for safety and communication needs will be reviewed on a case by case basis.   Special instructions:   [place]- Preparing For Surgery  Before surgery, you can play an important role. Because skin is not sterile, your skin needs to be as free of germs as possible. You can reduce the number of germs on your skin by washing with CHG (chlorahexidine gluconate) Soap before surgery.  CHG is an antiseptic cleaner which kills germs and bonds with the skin to continue killing germs even after washing.    Oral Hygiene is also important to reduce your risk of infection.  Remember - BRUSH YOUR TEETH THE MORNING OF SURGERY WITH YOUR REGULAR TOOTHPASTE  Please do not use if you have an allergy to CHG or antibacterial soaps. If your skin becomes reddened/irritated stop using the CHG.  Do not shave (including legs and underarms) for at least 48 hours prior to first CHG shower. It is  OK to shave your face.  Please follow these instructions carefully.   Shower the NIGHT BEFORE SURGERY and the MORNING OF SURGERY  If you chose to wash your hair, wash your hair first as usual with your normal shampoo.  After you shampoo, rinse your hair and body thoroughly to remove the shampoo.  Use CHG Soap as you would any other liquid soap. You can apply CHG directly to the skin and wash gently with a scrungie or a clean washcloth.   Apply the CHG Soap to your body ONLY FROM THE NECK DOWN.  Do not use on open wounds or open sores. Avoid contact with your eyes, ears, mouth and genitals (private parts). Wash  Face and genitals (private parts)  with your normal soap.   Wash thoroughly, paying special attention to the area where your surgery will be performed.  Thoroughly rinse your body with warm water from the neck down.  DO NOT shower/wash with your normal soap after using and rinsing off the CHG Soap.  Pat yourself dry with a CLEAN TOWEL.  Wear CLEAN PAJAMAS to bed the night before surgery  Place CLEAN SHEETS on your bed the night before your surgery  DO NOT SLEEP WITH PETS.   Day of Surgery: Shower with CHG soap. Do not wear jewelry, make up, nail polish, gel polish, artificial nails, or any other type of covering on natural nails including finger and toenails. If patients have artificial nails, gel coating, etc. that need to be removed by a nail salon please have this removed prior to surgery. Surgery may need to be canceled/delayed if the surgeon/ anesthesia feels like the patient is unable to be adequately monitored. Do not wear lotions, powders, perfumes, or deodorant. Do not shave 48 hours prior to surgery.   Do not bring valuables to the hospital. [facility] is not responsible for any belongings or valuables. Wear Clean/Comfortable clothing the morning of surgery Remember to brush your teeth WITH YOUR REGULAR TOOTHPASTE.   Please read over the following fact sheets that you were given.   3 days prior to your procedure or After your COVID test   You are not required to quarantine however you are required to wear a well-fitting mask when you are out and around people not in your household. If your mask becomes wet or soiled, replace with a new one.   Wash your hands often with soap and water for 20 seconds or clean your hands with an alcohol-based hand sanitizer that contains at least 60% alcohol.   Do not share personal items.   Notify your provider:  o if you are in close contact with someone who has COVID  o or if you develop a fever of 100.4 or greater, sneezing,  cough, sore throat, shortness of breath or body aches.

## 2020-12-01 ENCOUNTER — Other Ambulatory Visit: Payer: Self-pay

## 2020-12-01 ENCOUNTER — Encounter (HOSPITAL_COMMUNITY)
Admission: RE | Admit: 2020-12-01 | Discharge: 2020-12-01 | Disposition: A | Discharge status: Home / Self Care | Payer: Medicare Other | Source: Ambulatory Visit | Attending: Orthopaedic Surgery | Admitting: Orthopaedic Surgery

## 2020-12-01 ENCOUNTER — Encounter (HOSPITAL_COMMUNITY): Payer: Self-pay

## 2020-12-01 VITALS — BP 155/87 | HR 71 | Temp 98.2°F | Resp 18 | Ht 62.0 in | Wt 212.3 lb

## 2020-12-01 DIAGNOSIS — Z20822 Contact with and (suspected) exposure to covid-19: Secondary | ICD-10-CM | POA: Diagnosis not present

## 2020-12-01 DIAGNOSIS — Z01812 Encounter for preprocedural laboratory examination: Secondary | ICD-10-CM | POA: Diagnosis present

## 2020-12-01 DIAGNOSIS — Z01818 Encounter for other preprocedural examination: Secondary | ICD-10-CM

## 2020-12-01 DIAGNOSIS — M1712 Unilateral primary osteoarthritis, left knee: Secondary | ICD-10-CM | POA: Insufficient documentation

## 2020-12-01 LAB — TYPE AND SCREEN
ABO/RH(D): A NEG
Antibody Screen: NEGATIVE

## 2020-12-01 LAB — CBC
HCT: 42.3 % (ref 36.0–46.0)
Hemoglobin: 13.5 g/dL (ref 12.0–15.0)
MCH: 29.2 pg (ref 26.0–34.0)
MCHC: 31.9 g/dL (ref 30.0–36.0)
MCV: 91.4 fL (ref 80.0–100.0)
Platelets: 190 10*3/uL (ref 150–400)
RBC: 4.63 MIL/uL (ref 3.87–5.11)
RDW: 13.5 % (ref 11.5–15.5)
WBC: 5.4 10*3/uL (ref 4.0–10.5)
nRBC: 0 % (ref 0.0–0.2)

## 2020-12-01 LAB — SURGICAL PCR SCREEN
MRSA, PCR: NEGATIVE
Staphylococcus aureus: NEGATIVE

## 2020-12-01 LAB — SARS CORONAVIRUS 2 (TAT 6-24 HRS): SARS Coronavirus 2: NEGATIVE

## 2020-12-01 NOTE — Progress Notes (Signed)
PCP - Jamal Collin, PA-C Cardiologist - denies  PPM/ICD - denies   Chest x-ray - 12/27/19 EKG - 12/27/19 Stress Test - 12/28/19 ECHO - 12/28/19 Cardiac Cath - denies  Sleep Study - pt states she had one "many years ago" and was diagnosed with mild sleep apnea but it "cleared up on its own" CPAP - no  DM- denies  Blood Thinner Instructions: n/a Aspirin Instructions: n/a  ERAS Protcol - yes PRE-SURGERY Ensure given  COVID TEST- 12/01/20 at PAT   Anesthesia review: no  Patient denies shortness of breath, fever, cough and chest pain at PAT appointment   All instructions explained to the patient, with a verbal understanding of the material. Patient agrees to go over the instructions while at home for a better understanding. Patient also instructed to wear a mask in public after being tested for COVID-19. The opportunity to ask questions was provided.

## 2020-12-04 ENCOUNTER — Ambulatory Visit: Payer: Medicare Other | Admitting: Orthopaedic Surgery

## 2020-12-05 ENCOUNTER — Encounter (HOSPITAL_COMMUNITY): Payer: Self-pay | Admitting: Orthopaedic Surgery

## 2020-12-05 ENCOUNTER — Observation Stay (HOSPITAL_COMMUNITY)
Admission: RE | Admit: 2020-12-05 | Discharge: 2020-12-08 | Disposition: A | Discharge status: Home / Self Care | Payer: Medicare Other | Attending: Orthopaedic Surgery | Admitting: Orthopaedic Surgery

## 2020-12-05 ENCOUNTER — Other Ambulatory Visit: Payer: Self-pay

## 2020-12-05 ENCOUNTER — Encounter (HOSPITAL_COMMUNITY)
Admission: RE | Disposition: A | Discharge status: Home / Self Care | Payer: Self-pay | Source: Home / Self Care | Attending: Orthopaedic Surgery

## 2020-12-05 ENCOUNTER — Observation Stay (HOSPITAL_COMMUNITY): Payer: Medicare Other

## 2020-12-05 ENCOUNTER — Ambulatory Visit (HOSPITAL_COMMUNITY): Payer: Medicare Other | Admitting: Certified Registered Nurse Anesthetist

## 2020-12-05 DIAGNOSIS — Z96651 Presence of right artificial knee joint: Secondary | ICD-10-CM | POA: Insufficient documentation

## 2020-12-05 DIAGNOSIS — Z20822 Contact with and (suspected) exposure to covid-19: Secondary | ICD-10-CM | POA: Insufficient documentation

## 2020-12-05 DIAGNOSIS — E039 Hypothyroidism, unspecified: Secondary | ICD-10-CM | POA: Diagnosis not present

## 2020-12-05 DIAGNOSIS — Z96652 Presence of left artificial knee joint: Secondary | ICD-10-CM

## 2020-12-05 DIAGNOSIS — G8929 Other chronic pain: Secondary | ICD-10-CM | POA: Insufficient documentation

## 2020-12-05 DIAGNOSIS — M1712 Unilateral primary osteoarthritis, left knee: Secondary | ICD-10-CM | POA: Diagnosis present

## 2020-12-05 DIAGNOSIS — M179 Osteoarthritis of knee, unspecified: Secondary | ICD-10-CM | POA: Diagnosis present

## 2020-12-05 HISTORY — PX: TOTAL KNEE ARTHROPLASTY: SHX125

## 2020-12-05 LAB — ABO/RH: ABO/RH(D): A NEG

## 2020-12-05 SURGERY — ARTHROPLASTY, KNEE, TOTAL
Anesthesia: Spinal | Site: Knee | Laterality: Left

## 2020-12-05 MED ORDER — 0.9 % SODIUM CHLORIDE (POUR BTL) OPTIME
TOPICAL | Status: DC | PRN
  Administered 2020-12-05: 1000 mL

## 2020-12-05 MED ORDER — ASPIRIN 81 MG PO CHEW
81.0000 mg | CHEWABLE_TABLET | Freq: Two times a day (BID) | ORAL | Status: DC
  Administered 2020-12-05 – 2020-12-08 (×6): 81 mg via ORAL
  Filled 2020-12-05 (×6): qty 1

## 2020-12-05 MED ORDER — FENTANYL CITRATE (PF) 100 MCG/2ML IJ SOLN
INTRAMUSCULAR | Status: AC
  Administered 2020-12-05: 50 ug via INTRAVENOUS
  Filled 2020-12-05: qty 2

## 2020-12-05 MED ORDER — METHOCARBAMOL 500 MG PO TABS
500.0000 mg | ORAL_TABLET | Freq: Four times a day (QID) | ORAL | Status: DC | PRN
  Administered 2020-12-06 – 2020-12-08 (×4): 500 mg via ORAL
  Filled 2020-12-05 (×4): qty 1

## 2020-12-05 MED ORDER — FENTANYL CITRATE (PF) 250 MCG/5ML IJ SOLN
INTRAMUSCULAR | Status: AC
  Filled 2020-12-05: qty 5

## 2020-12-05 MED ORDER — FENTANYL CITRATE (PF) 100 MCG/2ML IJ SOLN: 25.0000 ug | INTRAMUSCULAR | Status: DC | PRN

## 2020-12-05 MED ORDER — TRANEXAMIC ACID-NACL 1000-0.7 MG/100ML-% IV SOLN
1000.0000 mg | INTRAVENOUS | Status: AC
  Administered 2020-12-05: 1000 mg via INTRAVENOUS

## 2020-12-05 MED ORDER — CEFAZOLIN SODIUM-DEXTROSE 2-4 GM/100ML-% IV SOLN
2.0000 g | INTRAVENOUS | Status: AC
  Administered 2020-12-05: 2 g via INTRAVENOUS

## 2020-12-05 MED ORDER — LEVOTHYROXINE SODIUM 75 MCG PO TABS
150.0000 ug | ORAL_TABLET | Freq: Every day | ORAL | Status: DC
  Administered 2020-12-06 – 2020-12-08 (×3): 150 ug via ORAL
  Filled 2020-12-05 (×3): qty 2

## 2020-12-05 MED ORDER — OXYCODONE HCL 5 MG PO TABS: 5.0000 mg | ORAL_TABLET | Freq: Once | ORAL | Status: DC | PRN

## 2020-12-05 MED ORDER — OXYCODONE HCL 5 MG PO TABS
5.0000 mg | ORAL_TABLET | ORAL | Status: DC | PRN
  Administered 2020-12-05: 10 mg via ORAL
  Filled 2020-12-05 (×4): qty 2

## 2020-12-05 MED ORDER — CEFAZOLIN SODIUM-DEXTROSE 2-4 GM/100ML-% IV SOLN
INTRAVENOUS | Status: AC
  Filled 2020-12-05: qty 100

## 2020-12-05 MED ORDER — ROPIVACAINE HCL 5 MG/ML IJ SOLN
INTRAMUSCULAR | Status: DC | PRN
  Administered 2020-12-05: 30 mL via PERINEURAL

## 2020-12-05 MED ORDER — SODIUM CHLORIDE 0.9 % IR SOLN
Status: DC | PRN
  Administered 2020-12-05: 3000 mL

## 2020-12-05 MED ORDER — DIPHENHYDRAMINE HCL 12.5 MG/5ML PO ELIX: 12.5000 mg | ORAL_SOLUTION | ORAL | Status: DC | PRN

## 2020-12-05 MED ORDER — ALUM & MAG HYDROXIDE-SIMETH 200-200-20 MG/5ML PO SUSP: 30.0000 mL | ORAL | Status: DC | PRN

## 2020-12-05 MED ORDER — DOCUSATE SODIUM 100 MG PO CAPS
100.0000 mg | ORAL_CAPSULE | Freq: Two times a day (BID) | ORAL | Status: DC
  Administered 2020-12-05 – 2020-12-08 (×6): 100 mg via ORAL
  Filled 2020-12-05 (×6): qty 1

## 2020-12-05 MED ORDER — CEFAZOLIN SODIUM-DEXTROSE 1-4 GM/50ML-% IV SOLN
1.0000 g | Freq: Four times a day (QID) | INTRAVENOUS | Status: AC
  Administered 2020-12-05 – 2020-12-06 (×2): 1 g via INTRAVENOUS
  Filled 2020-12-05 (×2): qty 50

## 2020-12-05 MED ORDER — PROPOFOL 1000 MG/100ML IV EMUL
INTRAVENOUS | Status: AC
  Filled 2020-12-05: qty 100

## 2020-12-05 MED ORDER — HYDROMORPHONE HCL 1 MG/ML IJ SOLN
0.5000 mg | INTRAMUSCULAR | Status: DC | PRN
  Administered 2020-12-05: 1 mg via INTRAVENOUS
  Administered 2020-12-06: 0.5 mg via INTRAVENOUS
  Filled 2020-12-05 (×2): qty 1

## 2020-12-05 MED ORDER — LACTATED RINGERS IV SOLN: INTRAVENOUS | Status: DC

## 2020-12-05 MED ORDER — PHENOL 1.4 % MT LIQD: 1.0000 | OROMUCOSAL | Status: DC | PRN

## 2020-12-05 MED ORDER — ORAL CARE MOUTH RINSE: 15.0000 mL | Freq: Once | OROMUCOSAL | Status: AC

## 2020-12-05 MED ORDER — MENTHOL 3 MG MT LOZG: 1.0000 | LOZENGE | OROMUCOSAL | Status: DC | PRN

## 2020-12-05 MED ORDER — MIDAZOLAM HCL 2 MG/2ML IJ SOLN
INTRAMUSCULAR | Status: AC
  Filled 2020-12-05: qty 2

## 2020-12-05 MED ORDER — ONDANSETRON HCL 4 MG/2ML IJ SOLN: 4.0000 mg | Freq: Once | INTRAMUSCULAR | Status: DC | PRN

## 2020-12-05 MED ORDER — PROPOFOL 500 MG/50ML IV EMUL
INTRAVENOUS | Status: DC | PRN
  Administered 2020-12-05: 50 ug/kg/min via INTRAVENOUS

## 2020-12-05 MED ORDER — POVIDONE-IODINE 10 % EX SWAB
2.0000 "application " | Freq: Once | CUTANEOUS | Status: AC
  Administered 2020-12-05: 2 via TOPICAL

## 2020-12-05 MED ORDER — PANTOPRAZOLE SODIUM 40 MG PO TBEC
40.0000 mg | DELAYED_RELEASE_TABLET | Freq: Every day | ORAL | Status: DC
  Administered 2020-12-06 – 2020-12-08 (×3): 40 mg via ORAL
  Filled 2020-12-05 (×3): qty 1

## 2020-12-05 MED ORDER — BUPIVACAINE IN DEXTROSE 0.75-8.25 % IT SOLN
INTRATHECAL | Status: DC | PRN
  Administered 2020-12-05: 1.6 mL via INTRATHECAL

## 2020-12-05 MED ORDER — KETOROLAC TROMETHAMINE 15 MG/ML IJ SOLN
15.0000 mg | Freq: Once | INTRAMUSCULAR | Status: DC
  Filled 2020-12-05: qty 1

## 2020-12-05 MED ORDER — MIDAZOLAM HCL 2 MG/2ML IJ SOLN
INTRAMUSCULAR | Status: AC
  Administered 2020-12-05: 1 mg via INTRAVENOUS
  Filled 2020-12-05: qty 2

## 2020-12-05 MED ORDER — TRANEXAMIC ACID-NACL 1000-0.7 MG/100ML-% IV SOLN
INTRAVENOUS | Status: AC
  Filled 2020-12-05: qty 100

## 2020-12-05 MED ORDER — FENTANYL CITRATE (PF) 100 MCG/2ML IJ SOLN: 50.0000 ug | Freq: Once | INTRAMUSCULAR | Status: AC

## 2020-12-05 MED ORDER — EPHEDRINE SULFATE-NACL 50-0.9 MG/10ML-% IV SOSY
PREFILLED_SYRINGE | INTRAVENOUS | Status: DC | PRN
  Administered 2020-12-05: 10 mg via INTRAVENOUS

## 2020-12-05 MED ORDER — CHLORHEXIDINE GLUCONATE 0.12 % MT SOLN
OROMUCOSAL | Status: AC
  Administered 2020-12-05: 15 mL via OROMUCOSAL
  Filled 2020-12-05: qty 15

## 2020-12-05 MED ORDER — METHOCARBAMOL 1000 MG/10ML IJ SOLN
500.0000 mg | Freq: Four times a day (QID) | INTRAVENOUS | Status: DC | PRN
  Filled 2020-12-05: qty 5

## 2020-12-05 MED ORDER — SODIUM CHLORIDE 0.9 % IV SOLN: INTRAVENOUS | Status: DC

## 2020-12-05 MED ORDER — MIDAZOLAM HCL 2 MG/2ML IJ SOLN: 1.0000 mg | Freq: Once | INTRAMUSCULAR | Status: AC

## 2020-12-05 MED ORDER — PHENYLEPHRINE HCL-NACL 20-0.9 MG/250ML-% IV SOLN
INTRAVENOUS | Status: DC | PRN
  Administered 2020-12-05: 25 ug/min via INTRAVENOUS

## 2020-12-05 MED ORDER — OXYCODONE HCL 5 MG/5ML PO SOLN: 5.0000 mg | Freq: Once | ORAL | Status: DC | PRN

## 2020-12-05 MED ORDER — CHLORHEXIDINE GLUCONATE 0.12 % MT SOLN: 15.0000 mL | Freq: Once | OROMUCOSAL | Status: AC

## 2020-12-05 MED ORDER — OXYCODONE HCL 5 MG PO TABS
10.0000 mg | ORAL_TABLET | ORAL | Status: DC | PRN
  Administered 2020-12-05 – 2020-12-07 (×10): 10 mg via ORAL
  Administered 2020-12-08 (×2): 15 mg via ORAL
  Filled 2020-12-05 (×2): qty 2
  Filled 2020-12-05: qty 3
  Filled 2020-12-05 (×5): qty 2
  Filled 2020-12-05: qty 3

## 2020-12-05 MED ORDER — ACETAMINOPHEN 325 MG PO TABS
325.0000 mg | ORAL_TABLET | Freq: Four times a day (QID) | ORAL | Status: DC | PRN
  Administered 2020-12-05 – 2020-12-06 (×3): 650 mg via ORAL
  Administered 2020-12-07: 325 mg via ORAL
  Administered 2020-12-07 (×2): 650 mg via ORAL
  Filled 2020-12-05: qty 1
  Filled 2020-12-05 (×5): qty 2

## 2020-12-05 MED ORDER — ONDANSETRON HCL 4 MG/2ML IJ SOLN: 4.0000 mg | Freq: Four times a day (QID) | INTRAMUSCULAR | Status: DC | PRN

## 2020-12-05 MED ORDER — LIDOCAINE 2% (20 MG/ML) 5 ML SYRINGE
INTRAMUSCULAR | Status: DC | PRN
  Administered 2020-12-05: 60 mg via INTRAVENOUS

## 2020-12-05 MED ORDER — ONDANSETRON HCL 4 MG PO TABS: 4.0000 mg | ORAL_TABLET | Freq: Four times a day (QID) | ORAL | Status: DC | PRN

## 2020-12-05 MED ORDER — AMISULPRIDE (ANTIEMETIC) 5 MG/2ML IV SOLN: 10.0000 mg | Freq: Once | INTRAVENOUS | Status: DC | PRN

## 2020-12-05 SURGICAL SUPPLY — 56 items
BAG COUNTER SPONGE SURGICOUNT (BAG) ×2 IMPLANT
BANDAGE ESMARK 6X9 LF (GAUZE/BANDAGES/DRESSINGS) ×1 IMPLANT
BEARIN INSERT TIBIAL SZ 3 11 (Insert) ×2 IMPLANT
BEARING INSERT TIBIAL SZ 3 11 (Insert) ×1 IMPLANT
BLADE SAG 18X100X1.27 (BLADE) ×2 IMPLANT
BNDG ELASTIC 6X5.8 VLCR STR LF (GAUZE/BANDAGES/DRESSINGS) ×2 IMPLANT
BNDG ESMARK 6X9 LF (GAUZE/BANDAGES/DRESSINGS) ×2
COVER SURGICAL LIGHT HANDLE (MISCELLANEOUS) ×2 IMPLANT
CUFF TOURN DUAL PORT 34 (TOURNIQUET CUFF) ×2 IMPLANT
DRAPE EXTREMITY T 121X128X90 (DISPOSABLE) ×2 IMPLANT
DRAPE HALF SHEET 40X57 (DRAPES) ×2 IMPLANT
DRAPE INCISE IOBAN 66X45 STRL (DRAPES) ×2 IMPLANT
DRAPE U-SHAPE 47X51 STRL (DRAPES) ×2 IMPLANT
DRSG PAD ABDOMINAL 8X10 ST (GAUZE/BANDAGES/DRESSINGS) ×2 IMPLANT
DURAPREP 26ML APPLICATOR (WOUND CARE) ×4 IMPLANT
ELECT REM PT RETURN 9FT ADLT (ELECTROSURGICAL) ×2
ELECTRODE REM PT RTRN 9FT ADLT (ELECTROSURGICAL) ×1 IMPLANT
FACESHIELD WRAPAROUND (MASK) ×4 IMPLANT
FEMORAL POSTERIOR SZ3 LT KNEE (Orthopedic Implant) ×1 IMPLANT
GAUZE SPONGE 4X4 12PLY STRL (GAUZE/BANDAGES/DRESSINGS) ×2 IMPLANT
GAUZE XEROFORM 1X8 LF (GAUZE/BANDAGES/DRESSINGS) ×2 IMPLANT
GLOVE SRG 8 PF TXTR STRL LF DI (GLOVE) ×2 IMPLANT
GLOVE SURG ORTHO LTX SZ7.5 (GLOVE) ×2 IMPLANT
GLOVE SURG ORTHO LTX SZ8 (GLOVE) ×2 IMPLANT
GLOVE SURG UNDER POLY LF SZ8 (GLOVE) ×2
GOWN STRL REUS W/ TWL LRG LVL3 (GOWN DISPOSABLE) ×1 IMPLANT
GOWN STRL REUS W/ TWL XL LVL3 (GOWN DISPOSABLE) ×2 IMPLANT
GOWN STRL REUS W/TWL LRG LVL3 (GOWN DISPOSABLE) ×1
GOWN STRL REUS W/TWL XL LVL3 (GOWN DISPOSABLE) ×2
HANDPIECE INTERPULSE COAX TIP (DISPOSABLE) ×1
IMMOBILIZER KNEE 22 UNIV (SOFTGOODS) ×2 IMPLANT
KIT BASIN OR (CUSTOM PROCEDURE TRAY) ×2 IMPLANT
KIT TURNOVER KIT B (KITS) ×2 IMPLANT
KNEE PATELLA ASYMMETRIC 9X29 (Knees) ×2 IMPLANT
KNEE TIBIAL COMPONENT SZ3 (Knees) ×2 IMPLANT
MANIFOLD NEPTUNE II (INSTRUMENTS) ×2 IMPLANT
NS IRRIG 1000ML POUR BTL (IV SOLUTION) ×2 IMPLANT
PACK TOTAL JOINT (CUSTOM PROCEDURE TRAY) ×2 IMPLANT
PAD ARMBOARD 7.5X6 YLW CONV (MISCELLANEOUS) ×2 IMPLANT
PADDING CAST COTTON 6X4 STRL (CAST SUPPLIES) ×2 IMPLANT
PIN FLUTED HEDLESS FIX 3.5X1/8 (PIN) ×2 IMPLANT
POSTERIOR FEMORAL SZ3 LT KNEE (Orthopedic Implant) ×2 IMPLANT
SET HNDPC FAN SPRY TIP SCT (DISPOSABLE) ×1 IMPLANT
SET PAD KNEE POSITIONER (MISCELLANEOUS) ×2 IMPLANT
SPONGE T-LAP 18X18 ~~LOC~~+RFID (SPONGE) ×4 IMPLANT
STAPLER VISISTAT 35W (STAPLE) ×2 IMPLANT
STRIP CLOSURE SKIN 1/2X4 (GAUZE/BANDAGES/DRESSINGS) ×2 IMPLANT
SUT VIC AB 0 CT1 27 (SUTURE) ×1
SUT VIC AB 0 CT1 27XBRD ANBCTR (SUTURE) ×1 IMPLANT
SUT VIC AB 1 CT1 27 (SUTURE) ×3
SUT VIC AB 1 CT1 27XBRD ANBCTR (SUTURE) ×3 IMPLANT
SUT VIC AB 2-0 CT1 27 (SUTURE) ×3
SUT VIC AB 2-0 CT1 TAPERPNT 27 (SUTURE) ×3 IMPLANT
TOWEL GREEN STERILE (TOWEL DISPOSABLE) ×2 IMPLANT
TOWEL GREEN STERILE FF (TOWEL DISPOSABLE) ×2 IMPLANT
WRAP KNEE MAXI GEL POST OP (GAUZE/BANDAGES/DRESSINGS) ×2 IMPLANT

## 2020-12-05 NOTE — Anesthesia Procedure Notes (Signed)
Procedure Name: MAC Date/Time: 12/05/2020 12:17 PM Performed by: Alain Marion, CRNA Pre-anesthesia Checklist: Patient identified, Emergency Drugs available, Suction available and Patient being monitored Patient Re-evaluated:Patient Re-evaluated prior to induction Oxygen Delivery Method: Simple face mask

## 2020-12-05 NOTE — Evaluation (Signed)
Physical Therapy Evaluation Patient Details Name: Bonnie [name] MRN: [id number] DOB: 05-17-1940 Today's Date: 12/05/2020  History of Present Illness  Pt adm 11/1 for lt TKR. PMH - rt TKR, arthritis  Clinical Impression  Patient able to tolerate EOB and briefly standing to step toward [facility] though limited by pain and some nausea.  She presents with decreased mobility due to pain, limited activity tolerance, decreased L LE ROM and strength, decreased balance and she will benefit from skilled PT in the acute setting prior to d/c to STSNF level rehab.  She lives alone and plans rehab prior to home.  PT to continue to follow.        Recommendations for follow up therapy are one component of a multi-disciplinary discharge planning process, led by the attending physician.  Recommendations may be updated based on patient status, additional functional criteria and insurance authorization.  Follow Up Recommendations Skilled nursing-short term rehab (<3 hours/day)    Assistance Recommended at Discharge Frequent or constant Supervision/Assistance  Functional Status Assessment Patient has had a recent decline in their functional status and demonstrates the ability to make significant improvements in function in a reasonable and predictable amount of time.  Equipment Recommendations  None recommended by PT    Recommendations for Other Services       Precautions / Restrictions Precautions Precautions: Fall;Knee Required Braces or Orthoses: Knee Immobilizer - Left Restrictions Weight Bearing Restrictions: Yes LLE Weight Bearing: Weight bearing as tolerated      Mobility  Bed Mobility Overal bed mobility: Needs Assistance Bed Mobility: Supine to Sit;Sit to Supine     Supine to sit: Min assist;HOB elevated Sit to supine: Mod assist   General bed mobility comments: assist for L LE to sit and increased time, to supine assist for both legs into bed    Transfers Overall transfer level: Needs  assistance Equipment used: Rolling walker (2 wheels) Transfers: Sit to/from Stand Sit to Stand: Mod assist           General transfer comment: lifting help from EOB and increased time    Ambulation/Gait Ambulation/Gait assistance: Min assist Gait Distance (Feet): 2 Feet Assistive device: Rolling walker (2 wheels) Gait Pattern/deviations: Step-to pattern;Decreased stride length;Antalgic     General Gait Details: side steps to [facility]  Stairs            Wheelchair Mobility    Modified Rankin (Stroke Patients Only)       Balance Overall balance assessment: Needs assistance   Sitting balance-Leahy Scale: Good     Standing balance support: Bilateral upper extremity supported Standing balance-Leahy Scale: Poor Standing balance comment: UE support for balance                             Pertinent Vitals/Pain Pain Assessment: Faces Faces Pain Scale: Hurts even more Pain Location: L knee Pain Descriptors / Indicators: Grimacing Pain Intervention(s): Premedicated before session    Home Living Family/patient expects to be discharged to:: Private residence Living Arrangements: Alone Available Help at Discharge: Family;Available PRN/intermittently Type of Home: House Home Access: Stairs to enter Entrance Stairs-Rails: Doctor, general practice of Steps: 2-3   Home Layout: One level Home Equipment: Agricultural consultant (2 wheels) Additional Comments: planning again to go to SNF for rehab    Prior Function Prior Level of Function : Independent/Modified Independent;Driving                     Hand  Dominance        Extremity/Trunk Assessment   Upper Extremity Assessment Upper Extremity Assessment: Overall WFL for tasks assessed    Lower Extremity Assessment Lower Extremity Assessment: LLE deficits/detail LLE Deficits / Details: flexion about 45 in supine, able to perform quad set and ankle DF/PF LLE: Unable to fully assess due to  pain       Communication   Communication: No difficulties  Cognition Arousal/Alertness: Awake/alert Behavior During Therapy: WFL for tasks assessed/performed Overall Cognitive Status: Within Functional Limits for tasks assessed                                          General Comments General comments (skin integrity, edema, etc.): daughter present and supportive, lives close    Exercises Total Joint Exercises Ankle Circles/Pumps: AROM;Both;5 reps;Supine Quad Sets: AROM;5 reps;Left;Supine Heel Slides: AAROM;Left;Supine;Other reps (comment) (3 reps withpain)   Assessment/Plan    PT Assessment Patient needs continued PT services  PT Problem List Decreased strength;Decreased mobility;Decreased activity tolerance;Decreased balance;Decreased knowledge of use of DME;Pain;Decreased range of motion       PT Treatment Interventions DME instruction;Therapeutic activities;Therapeutic exercise;Gait training;Patient/family education;Balance training;Functional mobility training;Stair training    PT Goals (Current goals can be found in the Care Plan section)  Acute Rehab PT Goals Patient Stated Goal: to go to rehab PT Goal Formulation: With patient/family Time For Goal Achievement: 12/12/20 Potential to Achieve Goals: Good    Frequency 7X/week   Barriers to discharge Decreased caregiver support      Co-evaluation               AM-PAC PT "6 Clicks" Mobility  Outcome Measure Help needed turning from your back to your side while in a flat bed without using bedrails?: A Little Help needed moving from lying on your back to sitting on the side of a flat bed without using bedrails?: A Lot Help needed moving to and from a bed to a chair (including a wheelchair)?: A Lot Help needed standing up from a chair using your arms (e.g., wheelchair or bedside chair)?: A Lot Help needed to walk in hospital room?: A Lot Help needed climbing 3-5 steps with a railing? :  Total 6 Click Score: 12    End of Session Equipment Utilized During Treatment: Left knee immobilizer Activity Tolerance: Patient limited by pain Patient left: in bed;with call bell/phone within reach;with family/visitor present Nurse Communication: Mobility status PT Visit Diagnosis: Other abnormalities of gait and mobility (R26.89);Pain;Difficulty in walking, not elsewhere classified (R26.2) Pain - Right/Left: Left Pain - part of body: Knee    Time: 1552-0802 PT Time Calculation (min) (ACUTE ONLY): 28 min   Charges:   PT Evaluation $PT Eval Moderate Complexity: 1 Mod PT Treatments $Therapeutic Activity: 8-22 mins        Sheran Lawless, PT Acute Rehabilitation Services Pager:[phone] [phone] 12/05/2020   Bonnie [name] 12/05/2020, 6:38 PM

## 2020-12-05 NOTE — H&P (Signed)
TOTAL KNEE ADMISSION H&P  Patient is being admitted for left total knee arthroplasty.  Subjective:  Chief Complaint:left knee pain.  HPI: Bonnie [name], 80 y.o. female, has a history of pain and functional disability in the left knee due to arthritis and has failed non-surgical conservative treatments for greater than 12 weeks to includeNSAID's and/or analgesics, corticosteriod injections, viscosupplementation injections, flexibility and strengthening excercises, supervised PT with diminished ADL's post treatment, use of assistive devices, weight reduction as appropriate, and activity modification.  Onset of symptoms was gradual, starting 2 years ago with gradually worsening course since that time. The patient noted no past surgery on the left knee(s).  Patient currently rates pain in the left knee(s) at 10 out of 10 with activity. Patient has night pain, worsening of pain with activity and weight bearing, pain that interferes with activities of daily living, pain with passive range of motion, crepitus, and joint swelling.  Patient has evidence of subchondral sclerosis, periarticular osteophytes, and joint space narrowing by imaging studies. There is no active infection.  Patient Active Problem List   Diagnosis Date Noted   Unilateral primary osteoarthritis, left knee 10/24/2020   Status post total knee replacement, right 09/30/2017   Unilateral primary osteoarthritis, right knee 08/19/2017   Past Medical History:  Diagnosis Date   Anxiety    Arthritis    Hypothyroidism    Neutropenia ([place])    had this in the past, resolve in 2014   Sleep apnea    "mild"-does not use cpap    Past Surgical History:  Procedure Laterality Date   ABDOMINAL HYSTERECTOMY     APPENDECTOMY     removed with hysterectomy   BREAST SURGERY     lumpectomy x 2 2010-2012   CHOLECYSTECTOMY     [place]     removed as a child   TOTAL KNEE ARTHROPLASTY Right 09/30/2017    Procedure: RIGHT TOTAL KNEE ARTHROPLASTY;  Surgeon: Mcarthur Rossetti, MD;  Location: [place];  Service: Orthopedics;  Laterality: Right;    Current Facility-Administered Medications  Medication Dose Route Frequency Provider Last Rate Last Admin   ceFAZolin (ANCEF) 2-4 GM/100ML-% IVPB            ceFAZolin (ANCEF) IVPB 2g/100 mL premix  2 g Intravenous On Call to OR Pete Pelt, PA-C       lactated ringers infusion   Intravenous Continuous Albertha Ghee, MD 10 mL/hr at 12/05/20 1029 Continued from Pre-op at 12/05/20 1029   tranexamic acid (CYKLOKAPRON) 1000MG /136mL IVPB            tranexamic acid (CYKLOKAPRON) IVPB 1,000 mg  1,000 mg Intravenous To OR Pete Pelt, PA-C       Facility-Administered Medications Ordered in Other Encounters  Medication Dose Route Frequency Provider Last Rate Last Admin   ropivacaine (PF) 5 mg/mL (0.5%) (NAROPIN) injection   Peri-NEURAL Anesthesia Intra-op Lidia Collum, MD   30 mL at 12/05/20 1129   Allergies  Allergen Reactions   Metoclopramide Rash    Social History   Tobacco Use   Smoking status: Never   Smokeless tobacco: Never  Substance Use Topics   Alcohol use: Not Currently    History reviewed. No pertinent family history.   Review of Systems  Musculoskeletal:  Positive for gait problem and joint swelling.  All other systems reviewed and are negative.  Objective:  Physical Exam Vitals reviewed.  Constitutional:      Appearance: Normal appearance.  HENT:  Head: Normocephalic and atraumatic.  Eyes:     Extraocular Movements: Extraocular movements intact.     Pupils: Pupils are equal, round, and reactive to light.  Cardiovascular:     Rate and Rhythm: Normal rate.  Pulmonary:     Effort: Pulmonary effort is normal.     Breath sounds: Normal breath sounds.  Abdominal:     Palpations: Abdomen is soft.  Musculoskeletal:     Cervical back: Normal range of motion and neck supple.     Left knee: Effusion, bony  tenderness and crepitus present. Decreased range of motion. Tenderness present over the medial joint line, lateral joint line and patellar tendon. Abnormal alignment and abnormal meniscus.  Neurological:     Mental Status: She is alert and oriented to person, place, and time.  Psychiatric:        Behavior: Behavior normal.    Vital signs in last 24 hours: Temp:  [98.2 F (36.8 C)] 98.2 F (36.8 C) (11/01 0953) Pulse Rate:  [56-74] 56 (11/01 1135) Resp:  [12-18] 12 (11/01 1135) BP: (136-164)/(63-97) 142/74 (11/01 1135) SpO2:  [94 %-97 %] 95 % (11/01 1135) Weight:  [96.2 kg] 96.2 kg (11/01 0953)  Labs:   Estimated body mass index is 38.78 kg/m as calculated from the following:   Height as of this encounter: 5\' 2"  (1.575 m).   Weight as of this encounter: 96.2 kg.   Imaging Review Plain radiographs demonstrate severe degenerative joint disease of the left knee(s). The overall alignment ismild varus. The bone quality appears to be good for age and reported activity level.      Assessment/Plan:  End stage arthritis, left knee   The patient history, physical examination, clinical judgment of the provider and imaging studies are consistent with end stage degenerative joint disease of the left knee(s) and total knee arthroplasty is deemed medically necessary. The treatment options including medical management, injection therapy arthroscopy and arthroplasty were discussed at length. The risks and benefits of total knee arthroplasty were presented and reviewed. The risks due to aseptic loosening, infection, stiffness, patella tracking problems, thromboembolic complications and other imponderables were discussed. The patient acknowledged the explanation, agreed to proceed with the plan and consent was signed. Patient is being admitted for inpatient treatment for surgery, pain control, PT, OT, prophylactic antibiotics, VTE prophylaxis, progressive ambulation and ADL's and discharge planning.  The patient is planning to be discharged to skilled nursing facility

## 2020-12-05 NOTE — Op Note (Signed)
Bonnie [name], Bonnie [name] MEDICAL RECORD NO: [id number] ACCOUNT NO: [id number] DATE OF BIRTH: 08-29-1940 FACILITY: [facility] LOCATION: [facility] PHYSICIAN: Vanita Panda. Magnus Ivan, MD  Operative Report   DATE OF PROCEDURE: 12/05/2020  PREOPERATIVE DIAGNOSIS:  Primary osteoarthritis and degenerative joint disease, left knee.  POSTOPERATIVE DIAGNOSIS:  Primary osteoarthritis and degenerative joint disease, left knee.  PROCEDURE:  Left total knee arthroplasty.  IMPLANTS:  Stryker Triathlon press-fit knee system with size 3 femur, size 3 tibial tray, 11 mm thickness fixed bearing polyethylene insert, size 29 patellar button.  SURGEON:  Vanita Panda. Magnus Ivan, MD.  ASSISTANT:  Hart Carwin, RNFA.  TOURNIQUET TIME:  Less than 1 hour.  ANTIBIOTICS:  2 g IV Ancef.    ESTIMATED BLOOD LOSS:  Less 100 mL  COMPLICATIONS:  None.  ANESTHESIA:   1.  Left lower extremity adductor canal block. 2.  Spinal.  COMPLICATIONS:  None.  INDICATIONS:  The patient is a 80 year old female well known to me.  We have actually replaced her right knee 3 years ago with a press-fit knee implant due to severe end-stage arthritis.  That knee has done very well for her.  She has well documented  end-stage arthritis of the left knee and at this point, does wish to proceed with a left total knee arthroplasty given the detrimental effect her left knee pain and arthritis is having on her mobility, her quality of life and activities of daily living.   Having had this before on the right side, she is fully aware of the risk of acute blood loss anemia, nerve or vessel injury, fracture, infection, DVT, implant failure and skin and soft tissue issues.  She understands our goals are to decrease pain,  improve mobility and overall improve quality of her life.  DESCRIPTION OF PROCEDURE:  After informed consent was obtained, appropriate left knee was marked and adductor canal block was obtained in the left lower extremity in the holding  room.  She was then brought to the operating room and sat up on the  operating table where spinal anesthesia was obtained.  She was laid in supine position on the operating table.  A Foley catheter was placed and a nonsterile tourniquet was placed around her upper left thigh.  Her left thigh, knee, leg, ankle and foot  were prepped and draped with DuraPrep and sterile drapes including a sterile stockinette.  A timeout was called and she was identified correct patient, correct left knee.  We then used Esmarch to wrap that leg and tourniquet was inflated to 300 mm of  pressure.  We then made a direct midline incision over the left knee and the patella and carried this proximally and distally.  I dissected down the knee joint, carried out a medial parapatellar arthrotomy, finding a very large joint effusion.  With the  knee in a flexed position, we removed remnants of ACL, PCL, medial and lateral meniscus and removed osteophytes from all three compartments including around the patella.  I also performed a small lateral release for patellar tracking.  We then used the  extramedullary cutting guide for making our proximal tibia cut, correcting for varus and valgus and neutral slope.  We set this cut to take 9 mm off the high side.  We made this cut without difficulty.  We decided to back it down 2 more millimeters based  on the thickness of the cut and that was done easily.  We then used the intramedullary guide for making our distal femoral cut with that cutting  block set for left knee at 5 degrees externally rotated for an 8 mm distal femoral cut.  Again, we made that  cut without difficulty, and brought the knee back down to full extension with a 9 mm extension block and achieved full extension.  We then went back to the femur and put our femoral sizing guide based off the epicondylar axis.  Based on this, we chose a  size 3 femur.  We put a 4-in-1 cutting block for size 3 femur, made our anterior and  posterior cuts, followed by our chamfer cuts.  We then made our femoral box cut for a size 3.  Attention was then turned back to the tibia.  We did choose a size 3  tibial tray for coverage of the tibial plateau setting the rotation off the tubercle and the femur.  We made our keel punch off of this.  Of note, her good quality bone allowed [place] to use a press-fit instrumentation for this knee once we saw good quality  bone just like her other side and that knee has done well for 3 years.  We then trialled our size 3 tibia, followed by our size 3 left femur.  We tried a 9 mm fixed bearing polyethylene insert and felt like an 11 mm gave more stability.  I was pleased  with range of motion and stability with 11 mm insert.  We then made a patellar cut, drilled three holes for a size 29 patellar button.  We then removed all instrumentation from the knee and irrigated the knee with normal saline solution using pulsatile  lavage.  We dried the knee real well and with the knee in a flexed position, placed our real Stryker press-fit tibial tray, size 3, followed by placing our real Stryker press-fit size 3 left femur.  We placed our real 11 mm fixed bearing polyethylene  insert and press fit our 29 patellar button.  I then put her through several cycles of motion.  I was pleased with range of motion and stability.  We then let the tourniquet down and hemostasis was obtained with electrocautery.  We then closed the  arthrotomy with interrupted #1 Vicryl suture followed by 0 Vicryl to close the deep tissue and 2-0 Vicryl to close subcutaneous tissue.  The skin was reapproximated with staples.  Xeroform and well-padded sterile dressing was applied.  She was taken to  recovery room in stable condition with all final counts being correct.  No complications noted.  Of note, Ky Barban, RNFA's assistance was appreciated through every aspect of this case.   PAA D: 12/05/2020 1:37:23 pm T: 12/05/2020 10:55:00 pm  JOB:  [id number]

## 2020-12-05 NOTE — Plan of Care (Signed)

## 2020-12-05 NOTE — Progress Notes (Signed)
PT Cancellation Note  Patient Details Name: Bonnie [name] MRN: [id number] DOB: 02/28/1940   Cancelled Treatment:    Reason Eval/Treat Not Completed: Pain limiting ability to participate   [facility] 12/05/2020, 4:23 PM Skip Mayer PT Acute Rehabilitation Services Pager [phone] Office [phone]

## 2020-12-05 NOTE — Brief Op Note (Signed)
12/05/2020  1:38 PM  PATIENT:  Bonnie [name]  80 y.o. female  PRE-OPERATIVE DIAGNOSIS:  left knee osteoarthritis  POST-OPERATIVE DIAGNOSIS:  left knee osteoarthritis  PROCEDURE:  Procedure(s) with comments: LEFT TOTAL KNEE ARTHROPLASTY (Left) - Needs RNFA  SURGEON:  Surgeon(s) and Role:    Kathryne Hitch, MD - Primary  ASSISTANTS: Hart Carwin, RNFA   ANESTHESIA:   regional and spinal  EBL:  50 mL   COUNTS:  YES  TOURNIQUET:   Total Tourniquet Time Documented: Thigh (Left) - 42 minutes Total: Thigh (Left) - 42 minutes   DICTATION: .Other Dictation: Dictation Number [id number]  PLAN OF CARE: Admit for overnight observation  PATIENT DISPOSITION:  PACU - hemodynamically stable.   Delay start of Pharmacological VTE agent (>24hrs) due to surgical blood loss or risk of bleeding: no

## 2020-12-05 NOTE — Anesthesia Preprocedure Evaluation (Signed)
Anesthesia Evaluation  Patient identified by MRN, date of birth, ID band Patient awake    Reviewed: Allergy & Precautions, NPO status , Patient's Chart, lab work & pertinent test results  History of Anesthesia Complications Negative for: history of anesthetic complications  Airway Mallampati: II  TM Distance: >3 FB Neck ROM: Full    Dental   Pulmonary sleep apnea ,    Pulmonary exam normal        Cardiovascular negative cardio ROS Normal cardiovascular exam     Neuro/Psych negative neurological ROS     GI/Hepatic Neg liver ROS, GERD  ,  Endo/Other  Hypothyroidism   Renal/GU negative Renal ROS  negative genitourinary   Musculoskeletal  (+) Arthritis ,   Abdominal   Peds  Hematology negative hematology ROS (+)   Anesthesia Other Findings  plts 190  Reproductive/Obstetrics                             Anesthesia Physical Anesthesia Plan  ASA: 2  Anesthesia Plan: Spinal   Post-op Pain Management:  Regional for Post-op pain   Induction:   PONV Risk Score and Plan: 2 and Propofol infusion, Treatment may vary due to age or medical condition, Ondansetron and TIVA  Airway Management Planned: Nasal Cannula and Simple Face Mask  Additional Equipment: None  Intra-op Plan:   Post-operative Plan:   Informed Consent: I have reviewed the patients History and Physical, chart, labs and discussed the procedure including the risks, benefits and alternatives for the proposed anesthesia with the patient or authorized representative who has indicated his/her understanding and acceptance.       Plan Discussed with:   Anesthesia Plan Comments:         Anesthesia Quick Evaluation

## 2020-12-05 NOTE — Transfer of Care (Signed)
Immediate Anesthesia Transfer of Care Note  Patient: Bonnie [name]  Procedure(s) Performed: LEFT TOTAL KNEE ARTHROPLASTY (Left: Knee)  Patient Location: PACU  Anesthesia Type:MAC, Regional and Spinal  Level of Consciousness: awake, alert  and oriented  Airway & Oxygen Therapy: Patient Spontanous Breathing  Post-op Assessment: Report given to RN and Post -op Vital signs reviewed and stable  Post vital signs: Reviewed and stable  Last Vitals:  Vitals Value Taken Time  BP 102/52 12/05/20 1401  Temp    Pulse 61 12/05/20 1402  Resp 14 12/05/20 1402  SpO2 97 % 12/05/20 1402  Vitals shown include unvalidated device data.  Last Pain:  Vitals:   12/05/20 1135  TempSrc:   PainSc: 0-No pain      Patients Stated Pain Goal: 1 (35/36/14 4315)  Complications: No notable events documented.

## 2020-12-05 NOTE — Anesthesia Procedure Notes (Signed)
Spinal  Patient location during procedure: OR Reason for block: surgical anesthesia Staffing Performed: anesthesiologist  Anesthesiologist: Kamyia Thomason E, MD Preanesthetic Checklist Completed: patient identified, IV checked, risks and benefits discussed, surgical consent, monitors and equipment checked, pre-op evaluation and timeout performed Spinal Block Patient position: sitting Prep: DuraPrep and site prepped and draped Patient monitoring: continuous pulse ox, blood pressure and heart rate Approach: midline Location: L3-4 Injection technique: single-shot Needle Needle type: Pencan  Needle gauge: 24 G Needle length: 9 cm Assessment Events: CSF return Additional Notes Functioning IV was confirmed and monitors were applied. Sterile prep and drape, including hand hygiene and sterile gloves were used. The patient was positioned and the spine was prepped. The skin was anesthetized with lidocaine.  Free flow of clear CSF was obtained prior to injecting local anesthetic into the CSF. The needle was carefully withdrawn. The patient tolerated the procedure well.     

## 2020-12-05 NOTE — Anesthesia Procedure Notes (Signed)
Anesthesia Regional Block: Adductor canal block   Pre-Anesthetic Checklist: , timeout performed,  Correct Patient, Correct Site, Correct Laterality,  Correct Procedure, Correct Position, site marked,  Risks and benefits discussed,  Surgical consent,  Pre-op evaluation,  At surgeon's request and post-op pain management  Laterality: Left  Prep: chloraprep       Needles:  Injection technique: Single-shot  Needle Type: Echogenic Stimulator Needle     Needle Length: 10cm  Needle Gauge: 20     Additional Needles:   Procedures:,,,, ultrasound used (permanent image in chart),,    Narrative:  Start time: 12/05/2020 11:25 AM End time: 12/05/2020 11:29 AM Injection made incrementally with aspirations every 5 mL.  Performed by: Personally  Anesthesiologist: Lucretia Kern, MD  Additional Notes: Standard monitors applied. Skin prepped. Good needle visualization with ultrasound. Injection made in 5cc increments with no resistance to injection. Patient tolerated the procedure well.

## 2020-12-05 NOTE — Anesthesia Postprocedure Evaluation (Signed)
Anesthesia Post Note  Patient: Bonnie [name]  Procedure(s) Performed: LEFT TOTAL KNEE ARTHROPLASTY (Left: Knee)     Patient location during evaluation: PACU Anesthesia Type: Spinal Level of consciousness: oriented and awake and alert Pain management: pain level controlled Vital Signs Assessment: post-procedure vital signs reviewed and stable Respiratory status: spontaneous breathing, respiratory function stable and nonlabored ventilation Cardiovascular status: blood pressure returned to baseline and stable Postop Assessment: no headache, no backache, no apparent nausea or vomiting and spinal receding Anesthetic complications: no   No notable events documented.  Last Vitals:  Vitals:   12/05/20 1449 12/05/20 1500  BP: 132/82 137/61  Pulse: (!) 59   Resp: 18 16  Temp:  36.4 C  SpO2: 100% 99%    Last Pain:  Vitals:   12/05/20 1500  TempSrc:   PainSc: 0-No pain                 Lucretia Kern

## 2020-12-06 DIAGNOSIS — M1712 Unilateral primary osteoarthritis, left knee: Secondary | ICD-10-CM | POA: Diagnosis not present

## 2020-12-06 LAB — CBC
HCT: 37.5 % (ref 36.0–46.0)
Hemoglobin: 12.1 g/dL (ref 12.0–15.0)
MCH: 28.9 pg (ref 26.0–34.0)
MCHC: 32.3 g/dL (ref 30.0–36.0)
MCV: 89.5 fL (ref 80.0–100.0)
Platelets: 165 10*3/uL (ref 150–400)
RBC: 4.19 MIL/uL (ref 3.87–5.11)
RDW: 13.7 % (ref 11.5–15.5)
WBC: 9.5 10*3/uL (ref 4.0–10.5)
nRBC: 0 % (ref 0.0–0.2)

## 2020-12-06 LAB — BASIC METABOLIC PANEL
Anion gap: 7 (ref 5–15)
BUN: 12 mg/dL (ref 8–23)
CO2: 25 mmol/L (ref 22–32)
Calcium: 8.6 mg/dL — ABNORMAL LOW (ref 8.9–10.3)
Chloride: 104 mmol/L (ref 98–111)
Creatinine, Ser: 0.91 mg/dL (ref 0.44–1.00)
GFR, Estimated: >60 mL/min (ref >60)
Glucose, Bld: 153 mg/dL — ABNORMAL HIGH (ref 70–99)
Potassium: 4.2 mmol/L (ref 3.5–5.1)
Sodium: 136 mmol/L (ref 135–145)

## 2020-12-06 NOTE — NC FL2 (Signed)
[place] MEDICAID FL2 LEVEL OF CARE SCREENING TOOL     IDENTIFICATION  Patient Name: Bonnie [name] Birthdate: February 06, 1940 Sex: female Admission Date (Current Location): 12/05/2020  [facility] and [place] Number:  Producer, television/film/video and Address:  [facility], [address], [place], [place] [address]      Provider Number: [id number]  Attending Physician Name and Address:  Kathryne Hitch,*  Relative Name and Phone Number:  Liz Beach (Daughter)   [phone]    Current Level of Care: Hospital Recommended Level of Care: Skilled Nursing Facility Prior Approval Number:    Date Approved/Denied:   PASRR Number: [id number]  Discharge Plan: SNF    Current Diagnoses: Patient Active Problem List   Diagnosis Date Noted   DJD (degenerative joint disease) of knee 12/05/2020   Status post left knee replacement 12/05/2020   Unilateral primary osteoarthritis, left knee 10/24/2020   Status post total knee replacement, right 09/30/2017   Unilateral primary osteoarthritis, right knee 08/19/2017    Orientation RESPIRATION BLADDER Height & Weight     Self, Time, Situation, Place  Normal Continent Weight: 212 lb (96.2 kg) Height:  5\' 2"  (157.5 cm)  BEHAVIORAL SYMPTOMS/MOOD NEUROLOGICAL BOWEL NUTRITION STATUS      Continent Diet  AMBULATORY STATUS COMMUNICATION OF NEEDS Skin   Limited Assist Verbally Surgical wounds                       Personal Care Assistance Level of Assistance  Feeding, Bathing, Dressing Bathing Assistance: Limited assistance Feeding assistance: Independent Dressing Assistance: Limited assistance     Functional Limitations Info  Speech, Hearing, Sight Sight Info: Impaired Hearing Info: Adequate Speech Info: Adequate    SPECIAL CARE FACTORS FREQUENCY  PT (By licensed PT), OT (By licensed OT)     PT Frequency: 5x/ week OT Frequency: 5x/ week            Contractures Contractures Info: Not present     Additional Factors Info  Code Status, Allergies Code Status Info: Full Allergies Info: Metoclopramide           Current Medications (12/06/2020):  This is the current hospital active medication list Current Facility-Administered Medications  Medication Dose Route Frequency Provider Last Rate Last Admin   0.9 %  sodium chloride infusion   Intravenous Continuous 13/03/2020, MD 75 mL/hr at 12/06/20 0532 New Bag at 12/06/20 0532   acetaminophen (TYLENOL) tablet 325-650 mg  325-650 mg Oral Q6H PRN 13/02/22, MD   650 mg at 12/06/20 0527   alum & mag hydroxide-simeth (MAALOX/MYLANTA) 200-200-20 MG/5ML suspension 30 mL  30 mL Oral Q4H PRN 08-13-2000, MD       aspirin chewable tablet 81 mg  81 mg Oral BID Kathryne Hitch, MD   81 mg at 12/06/20 13/02/22   diphenhydrAMINE (BENADRYL) 12.5 MG/5ML elixir 12.5-25 mg  12.5-25 mg Oral Q4H PRN 03-06-1984, MD       docusate sodium (COLACE) capsule 100 mg  100 mg Oral BID Kathryne Hitch, MD   100 mg at 12/06/20 0834   HYDROmorphone (DILAUDID) injection 0.5-1 mg  0.5-1 mg Intravenous Q4H PRN 13/02/22, MD   1 mg at 12/05/20 1728   ketorolac (TORADOL) 15 MG/ML injection 15 mg  15 mg Intravenous Once 13/01/22, MD       levothyroxine (SYNTHROID) tablet 150 mcg  150 mcg Oral Q0600 Kathryne Hitch, MD  150 mcg at 12/06/20 0254   menthol-cetylpyridinium (CEPACOL) lozenge 3 mg  1 lozenge Oral PRN Kathryne Hitch, MD       Or   phenol (CHLORASEPTIC) mouth spray 1 spray  1 spray Mouth/Throat PRN Kathryne Hitch, MD       methocarbamol (ROBAXIN) tablet 500 mg  500 mg Oral Q6H PRN Kathryne Hitch, MD       Or   methocarbamol (ROBAXIN) 500 mg in dextrose 5 % 50 mL IVPB  500 mg Intravenous Q6H PRN Kathryne Hitch, MD       ondansetron [facility]) tablet 4 mg  4 mg Oral Q6H PRN Kathryne Hitch, MD       Or   ondansetron  [facility]) injection 4 mg  4 mg Intravenous Q6H PRN Kathryne Hitch, MD       oxyCODONE (Oxy IR/ROXICODONE) immediate release tablet 10-15 mg  10-15 mg Oral Q4H PRN Kathryne Hitch, MD   10 mg at 12/06/20 1437   oxyCODONE (Oxy IR/ROXICODONE) immediate release tablet 5-10 mg  5-10 mg Oral Q4H PRN Kathryne Hitch, MD   10 mg at 12/05/20 1558   pantoprazole (PROTONIX) EC tablet 40 mg  40 mg Oral Daily Kathryne Hitch, MD   40 mg at 12/06/20 2706     Discharge Medications: Please see discharge summary for a list of discharge medications.  Relevant Imaging Results:  Relevant Lab Results:   Additional Information SSN # [id number];  5'1"  212lbs  Ralene Bathe, LCSWA

## 2020-12-06 NOTE — Plan of Care (Signed)

## 2020-12-06 NOTE — Progress Notes (Signed)
Physical Therapy Treatment Patient Details Name: Bonnie [name] MRN: [id number] DOB: November 15, 1940 Today's Date: 12/06/2020   History of Present Illness Pt adm 11/1 for lt TKR. PMH - rt TKR, arthritis    PT Comments    Pt was seen for control of standing balance and walking on RW in room, with assistance to maneuver around bed, then onto [facility].  Her effort was good but struggles with sticking of bandage on the seat of bari BSC.  Once there was able to dry herself, motivated to get up to stand and pivot back to bed.  Repositioning with L heel in a floating posture, and family in to be able to see how to assist her.  Follow up for more gait distances and strengthening as tolerated to L knee and hip.  Recommend a chair follow to ensure safety due to mild to moderate instability on brace and with control she generates for walker.  Re-instruct management of brace with every mobility session to place it in best location for transfer transitions.   Recommendations for follow up therapy are one component of a multi-disciplinary discharge planning process, led by the attending physician.  Recommendations may be updated based on patient status, additional functional criteria and insurance authorization.  Follow Up Recommendations  Follow physician's recommendations for discharge plan and follow up therapies     Assistance Recommended at Discharge Frequent or constant Supervision/Assistance  Equipment Recommendations  None recommended by PT    Recommendations for Other Services       Precautions / Restrictions Precautions Precautions: Fall;Knee Required Braces or Orthoses: Knee Immobilizer - Left Restrictions Weight Bearing Restrictions: Yes LLE Weight Bearing: Weight bearing as tolerated     Mobility  Bed Mobility Overal bed mobility: Needs Assistance Bed Mobility: Sit to Supine     Supine to sit: Min assist;HOB elevated     General bed mobility comments: assisted to side of bed with help  for LLE and trunk min assist    Transfers Overall transfer level: Needs assistance Equipment used: Rolling walker (2 wheels) Transfers: Sit to/from Stand Sit to Stand: Min assist;Mod assist           General transfer comment: mod to initiate and min to complete power up and steady    Ambulation/Gait Ambulation/Gait assistance: Min assist Gait Distance (Feet): 20 Feet Assistive device: Rolling walker (2 wheels) Gait Pattern/deviations: Step-to pattern;Step-through pattern;Decreased stride length;Decreased weight shift to left;Wide base of support Gait velocity: reduced Gait velocity interpretation: <1.31 ft/sec, indicative of household ambulator General Gait Details: sidesteps then forward steps to chair   Stairs             Wheelchair Mobility    Modified Rankin (Stroke Patients Only)       Balance Overall balance assessment: Needs assistance   Sitting balance-Leahy Scale: Good     Standing balance support: Bilateral upper extremity supported Standing balance-Leahy Scale: Poor Standing balance comment: requires UE support on walker                            Cognition Arousal/Alertness: Awake/alert Behavior During Therapy: WFL for tasks assessed/performed Overall Cognitive Status: Within Functional Limits for tasks assessed                                          Exercises Total Joint Exercises Ankle Circles/Pumps: AROM;5  reps Quad Sets: AROM;5 reps    General Comments General comments (skin integrity, edema, etc.): return to bed with trip to [facility] and was able to assist standing, walker control and shifting on [facility]      Pertinent Vitals/Pain Pain Assessment: Faces Faces Pain Scale: Hurts little more Pain Location: L knee Pain Descriptors / Indicators: Grimacing Pain Intervention(s): Limited activity within patient's tolerance;Repositioned;Ice applied    Home Living                          Prior  Function            PT Goals (current goals can now be found in the care plan section) Acute Rehab PT Goals Patient Stated Goal: to go to rehab Progress towards PT goals: Progressing toward goals    Frequency    7X/week      PT Plan Current plan remains appropriate    Co-evaluation              AM-PAC PT "6 Clicks" Mobility   Outcome Measure  Help needed turning from your back to your side while in a flat bed without using bedrails?: A Little Help needed moving from lying on your back to sitting on the side of a flat bed without using bedrails?: A Lot Help needed moving to and from a bed to a chair (including a wheelchair)?: A Lot Help needed standing up from a chair using your arms (e.g., wheelchair or bedside chair)?: A Lot Help needed to walk in hospital room?: A Lot Help needed climbing 3-5 steps with a railing? : Total 6 Click Score: 12    End of Session Equipment Utilized During Treatment: Left knee immobilizer;Gait belt Activity Tolerance: Patient limited by pain Patient left: in chair;with call bell/phone within reach;with chair alarm set Nurse Communication: Mobility status PT Visit Diagnosis: Other abnormalities of gait and mobility (R26.89);Pain;Difficulty in walking, not elsewhere classified (R26.2) Pain - Right/Left: Left Pain - part of body: Knee     Time: 2446-2863 PT Time Calculation (min) (ACUTE ONLY): 25 min  Charges:  $Gait Training: 8-22 mins $Therapeutic Activity: 8-22 mins          Ivar Drape 12/06/2020, 5:25 PM  Samul Dada, PT PhD Acute Rehab Dept. Number: [facility] [id number] and [facility] [phone]

## 2020-12-06 NOTE — Progress Notes (Signed)
Physical Therapy Treatment Patient Details Name: Bonnie [name] MRN: [id number] DOB: 08-19-1940 Today's Date: 12/06/2020   History of Present Illness Pt adm 11/1 for lt TKR. PMH - rt TKR, arthritis    PT Comments    Pt was seen for follow up to evaluation with demonstration of short walking distances but is motivated to make the effort to get to chair.  Her sidestepping is more controlled, with pt controlling walker direction and setting up to sit with recall of the need to control LLE in the transition.  Follow for goals of PT and work on stairs if needed, although per pt is heading to a rehab setting for recovery.  Focus on standing balance control, safety with walker and use of devices for L knee stability as ordered.     Recommendations for follow up therapy are one component of a multi-disciplinary discharge planning process, led by the attending physician.  Recommendations may be updated based on patient status, additional functional criteria and insurance authorization.  Follow Up Recommendations  Follow physician's recommendations for discharge plan and follow up therapies     Assistance Recommended at Discharge Frequent or constant Supervision/Assistance  Equipment Recommendations  None recommended by PT    Recommendations for Other Services       Precautions / Restrictions Precautions Precautions: Fall;Knee Required Braces or Orthoses: Knee Immobilizer - Left Restrictions Weight Bearing Restrictions: Yes LLE Weight Bearing: Weight bearing as tolerated     Mobility  Bed Mobility Overal bed mobility: Needs Assistance Bed Mobility: Supine to Sit     Supine to sit: Min assist;HOB elevated     General bed mobility comments: assisted to side of bed with help for LLE and trunk min assist    Transfers Overall transfer level: Needs assistance Equipment used: Rolling walker (2 wheels) Transfers: Sit to/from Stand Sit to Stand: Min assist;Mod assist            General transfer comment: mod to initiate and min to complete power up and steady    Ambulation/Gait Ambulation/Gait assistance: Min assist Gait Distance (Feet): 12 Feet Assistive device: Rolling walker (2 wheels) Gait Pattern/deviations: Step-to pattern;Step-through pattern;Decreased stride length;Decreased weight shift to left;Wide base of support Gait velocity: reduced Gait velocity interpretation: <1.31 ft/sec, indicative of household ambulator General Gait Details: sidesteps then forward steps to chair   Stairs             Wheelchair Mobility    Modified Rankin (Stroke Patients Only)       Balance Overall balance assessment: Needs assistance   Sitting balance-Leahy Scale: Good     Standing balance support: Bilateral upper extremity supported Standing balance-Leahy Scale: Poor Standing balance comment: requires UE support on walker                            Cognition Arousal/Alertness: Awake/alert Behavior During Therapy: WFL for tasks assessed/performed Overall Cognitive Status: Within Functional Limits for tasks assessed                                          Exercises Total Joint Exercises Ankle Circles/Pumps: AROM;5 reps Quad Sets: AROM;5 reps    General Comments General comments (skin integrity, edema, etc.): Pt is in bed with melted ice, walked a shor trip and then reapplied ice for pain management on L Knee  Pertinent Vitals/Pain Pain Assessment: Faces Faces Pain Scale: Hurts little more Pain Location: L knee Pain Descriptors / Indicators: Grimacing Pain Intervention(s): Limited activity within patient's tolerance;Monitored during session;Premedicated before session;Repositioned    Home Living                          Prior Function            PT Goals (current goals can now be found in the care plan section) Acute Rehab PT Goals Patient Stated Goal: to go to rehab Progress towards PT  goals: Progressing toward goals    Frequency    7X/week      PT Plan Current plan remains appropriate    Co-evaluation              AM-PAC PT "6 Clicks" Mobility   Outcome Measure  Help needed turning from your back to your side while in a flat bed without using bedrails?: A Little Help needed moving from lying on your back to sitting on the side of a flat bed without using bedrails?: A Lot Help needed moving to and from a bed to a chair (including a wheelchair)?: A Lot Help needed standing up from a chair using your arms (e.g., wheelchair or bedside chair)?: A Lot Help needed to walk in hospital room?: A Lot Help needed climbing 3-5 steps with a railing? : Total 6 Click Score: 12    End of Session Equipment Utilized During Treatment: Left knee immobilizer;Gait belt Activity Tolerance: Patient limited by pain Patient left: in chair;with call bell/phone within reach;with chair alarm set Nurse Communication: Mobility status PT Visit Diagnosis: Other abnormalities of gait and mobility (R26.89);Pain;Difficulty in walking, not elsewhere classified (R26.2) Pain - Right/Left: Left Pain - part of body: Knee     Time: 3143-8887 PT Time Calculation (min) (ACUTE ONLY): 28 min  Charges:  $Gait Training: 8-22 mins $Therapeutic Activity: 8-22 mins           Ivar Drape 12/06/2020, 1:56 PM  Samul Dada, PT PhD Acute Rehab Dept. Number: [facility] [id number] and [facility] [phone]

## 2020-12-06 NOTE — Plan of Care (Signed)

## 2020-12-06 NOTE — Progress Notes (Signed)
Subjective: 1 Day Post-Op Procedure(s) (LRB): LEFT TOTAL KNEE ARTHROPLASTY (Left) Patient reports pain as moderate.    Objective: Vital signs in last 24 hours: Temp:  [97.5 F (36.4 C)-100.7 F (38.2 C)] 99.4 F (37.4 C) (11/02 0650) Pulse Rate:  [56-97] 86 (11/02 0522) Resp:  [12-18] 18 (11/02 0522) BP: (102-164)/(52-97) 115/67 (11/02 0522) SpO2:  [92 %-100 %] 92 % (11/02 0522) Weight:  [96.2 kg] 96.2 kg (11/01 0953)  Intake/Output from previous day: 11/01 0701 - 11/02 0700 In: 1658.6 [P.O.:240; I.V.:1218.6; IV Piggyback:200] Out: 2050 [Urine:2000; Blood:50] Intake/Output this shift: No intake/output data recorded.  Recent Labs    12/06/20 0222  HGB 12.1   Recent Labs    12/06/20 0222  WBC 9.5  RBC 4.19  HCT 37.5  PLT 165   Recent Labs    12/06/20 0222  NA 136  K 4.2  CL 104  CO2 25  BUN 12  CREATININE 0.91  GLUCOSE 153*  CALCIUM 8.6*   No results for input(s): LABPT, INR in the last 72 hours.  Sensation intact distally Intact pulses distally Dorsiflexion/Plantar flexion intact Incision: dressing C/D/I No cellulitis present Compartment soft   Assessment/Plan: 1 Day Post-Op Procedure(s) (LRB): LEFT TOTAL KNEE ARTHROPLASTY (Left) Up with therapy Discharge to SNF next 1-2 days       Kathryne Hitch 12/06/2020, 7:48 AM

## 2020-12-06 NOTE — Discharge Instructions (Signed)

## 2020-12-06 NOTE — Progress Notes (Signed)
Mobility Specialist Progress Note   12/06/20 1800  Mobility  Activity Ambulated in room  Level of Assistance Moderate assist, patient does 50-74%  Assistive Device Front wheel walker  LLE Weight Bearing WBAT  Distance Ambulated (ft) 16 ft  Mobility Ambulated with assistance in room  Mobility Response Tolerated fair  Mobility performed by Mobility specialist  $Mobility charge 1 Mobility   Received pt in bed c/o being in 7/10 pain and tired from her PT session earlier, still agreeable to mobility. MinA for bed mobility but modA for STS. X2 standing breaks and x1 seated break d/t rapid fatigue. Needs minimal VC for R foot placement when sitting. Returned back to bed w/ call bell by side and family in the room. Post pain level 9/10, RN notified.   Frederico Hamman Mobility Specialist Phone Number [phone]

## 2020-12-06 NOTE — Progress Notes (Signed)
   12/06/20 2028  Assess: MEWS Score  Temp (!) 102.7 F (39.3 C)  BP 133/73  Pulse Rate 97  Resp 17  SpO2 92 %  Assess: MEWS Score  MEWS Temp 2  MEWS Systolic 0  MEWS Pulse 0  MEWS RR 0  MEWS LOC 0  MEWS Score 2  MEWS Score Color Yellow  Assess: if the MEWS score is Yellow or Red  Were vital signs taken at a resting state? Yes  Focused Assessment Change from prior assessment (see assessment flowsheet)  Early Detection of Sepsis Score *See Row Information* Low  MEWS guidelines implemented *See Row Information* Yes  Treat  MEWS Interventions Administered prn meds/treatments  Pain Scale 0-10  Pain Score 8  Pain Type Surgical pain  Pain Location Knee  Pain Orientation Left  Pain Descriptors / Indicators Aching  Pain Frequency Intermittent  Pain Intervention(s) Medication (See eMAR);Cold applied  Multiple Pain Sites No  Take Vital Signs  Increase Vital Sign Frequency  Yellow: Q 2hr X 2 then Q 4hr X 2, if remains yellow, continue Q 4hrs  Escalate  MEWS: Escalate Yellow: discuss with charge nurse/RN and consider discussing with provider and RRT  Notify: Charge Nurse/RN  Name of Charge Nurse/RN Notified Comptroller  Date Charge Nurse/RN Notified 12/06/20  Time Charge Nurse/RN Notified 2030  Notify: Rapid Response  Name of Rapid Response RN Notified n/a  Document  Patient Outcome Stabilized after interventions  Progress note created (see row info) Yes

## 2020-12-06 NOTE — TOC Initial Note (Addendum)
Transition of Care [facility]) - Initial/Assessment Note    Patient Details  Name: Bonnie [name] MRN: [id number] Date of Birth: [birth year]  Transition of Care [facility]) CM/SW Contact:    Epifanio Lesches, RN Phone Number: 12/06/2020, 2:18 PM  Clinical Narrative:          -s/p  L TKR  ,11/1    From home alone. Daughter lives next door. PTA independent with ADL'S, no DME usage. NCM received consult for possible SNF placement at time of discharge. NCM spoke with patient regarding PT recommendation of SNF placement at time of discharge. Patient reported that she is currently unable to care for self independently at home given her current physical needs and fall risk. Patient expressed understanding of PT recommendation and would like SNF placement at time of discharge. Patient reports preference for  [facility].  RNCM discussed insurance authorization process and provided Medicare SNF ratings list. Patient expressed being hopeful for rehab and to feel better soon. No further questions reported at this time. NCM to continue to follow and assist with discharge planning needs.   Expected Discharge Plan: Skilled Nursing Facility Barriers to Discharge: Continued Medical Work up   Patient Goals and CMS Choice   CMS Medicare.gov Compare Post Acute Care list provided to:: Patient    Expected Discharge Plan and Services Expected Discharge Plan: Skilled Nursing Facility   Discharge Planning Services: CM Consult   Living arrangements for the past 2 months: Single Family Home                                      Prior Living Arrangements/Services Living arrangements for the past 2 months: Single Family Home Lives with:: Self Patient language and need for interpreter reviewed:: Yes Do you feel safe going back to the place where you live?: Yes      Need for Family Participation in Patient Care: Yes (Comment) Care giver support system in place?: Yes (comment)   Criminal Activity/Legal  Involvement Pertinent to Current Situation/Hospitalization: No - Comment as needed  Activities of Daily Living Home Assistive Devices/Equipment: Eyeglasses, Blood pressure cuff ADL Screening (condition at time of admission) Patient's cognitive ability adequate to safely complete daily activities?: Yes Is the patient deaf or have difficulty hearing?: No Does the patient have difficulty seeing, even when wearing glasses/contacts?: No Does the patient have difficulty concentrating, remembering, or making decisions?: No Patient able to express need for assistance with ADLs?: Yes Does the patient have difficulty dressing or bathing?: No Independently performs ADLs?: Yes (appropriate for developmental age) Does the patient have difficulty walking or climbing stairs?: Yes Weakness of Legs: Left Weakness of Arms/Hands: None  Permission Sought/Granted   Permission granted to share information with : Yes, Verbal Permission Granted  Share Information with NAME: Liz Beach (Daughter) [phone]           Emotional Assessment Appearance:: Appears stated age Attitude/Demeanor/Rapport: Gracious Affect (typically observed): Accepting Orientation: : Oriented to Self, Oriented to Place, Oriented to  Time, Oriented to Situation Alcohol / Substance Use: Not Applicable Psych Involvement: No (comment)  Admission diagnosis:  DJD (degenerative joint disease) of knee [M17.9] Status post left knee replacement [Z96.652] Patient Active Problem List   Diagnosis Date Noted   DJD (degenerative joint disease) of knee 12/05/2020   Status post left knee replacement 12/05/2020   Unilateral primary osteoarthritis, left knee 10/24/2020   Status post total knee replacement, right  09/30/2017   Unilateral primary osteoarthritis, right knee 08/19/2017   PCP:  Jamal Collin, PA-C Pharmacy:   [id number] [place], [place] - [address] [place] [address] Phone:  [phone] Fax: [phone]     Social Determinants of Health (SDOH) Interventions    Readmission Risk Interventions No flowsheet data found.

## 2020-12-07 ENCOUNTER — Encounter (HOSPITAL_COMMUNITY): Payer: Self-pay | Admitting: Orthopaedic Surgery

## 2020-12-07 DIAGNOSIS — M1712 Unilateral primary osteoarthritis, left knee: Secondary | ICD-10-CM | POA: Diagnosis not present

## 2020-12-07 LAB — RESP PANEL BY RT-PCR (FLU A&B, COVID) ARPGX2
Influenza A by PCR: NEGATIVE
Influenza B by PCR: NEGATIVE
SARS Coronavirus 2 by RT PCR: NEGATIVE

## 2020-12-07 MED ORDER — OXYCODONE HCL 5 MG PO TABS
5.0000 mg | ORAL_TABLET | ORAL | 0 refills | Status: DC | PRN
Start: 2020-12-07 — End: 2020-12-26

## 2020-12-07 MED ORDER — ASPIRIN 81 MG PO CHEW: 81.0000 mg | CHEWABLE_TABLET | Freq: Two times a day (BID) | ORAL | 0 refills | Status: AC

## 2020-12-07 MED ORDER — METHOCARBAMOL 500 MG PO TABS: 500.0000 mg | ORAL_TABLET | Freq: Four times a day (QID) | ORAL | 0 refills | Status: AC | PRN

## 2020-12-07 NOTE — Progress Notes (Signed)
Physical Therapy Treatment Patient Details Name: Bonnie [name] MRN: [id number] DOB: 25-Feb-1940 Today's Date: 12/07/2020   History of Present Illness Pt adm 11/1 for lt TKR. PMH - rt TKR, arthritis    PT Comments    Pt was seen for mobility on RW, instructed transfers and worked on sitting OOB in chair with brief time there.  Pt asked to return to bed and was repositioned comfortably.  Her pain is managed with meds and applied ice to decrease edema and pain.  Daughter was in to support her at first of visit.  Follow up with her for home with continued recommendation for follow up therapy as ordered by MD.  Encourage OOB to chair this PM.   Recommendations for follow up therapy are one component of a multi-disciplinary discharge planning process, led by the attending physician.  Recommendations may be updated based on patient status, additional functional criteria and insurance authorization.  Follow Up Recommendations  Follow physician's recommendations for discharge plan and follow up therapies     Assistance Recommended at Discharge Frequent or constant Supervision/Assistance  Equipment Recommendations  None recommended by PT    Recommendations for Other Services       Precautions / Restrictions Precautions Precautions: Fall;Knee Precaution Comments: reviewed precautions including brace management Required Braces or Orthoses: Knee Immobilizer - Left Knee Immobilizer - Left: On except when in CPM Restrictions Weight Bearing Restrictions: Yes LLE Weight Bearing: Weight bearing as tolerated     Mobility  Bed Mobility Overal bed mobility: Needs Assistance Bed Mobility: Supine to Sit     Supine to sit: Min assist;HOB elevated     General bed mobility comments: min assist to support her LLE briefly off the bed    Transfers Overall transfer level: Needs assistance Equipment used: Rolling walker (2 wheels) Transfers: Sit to/from Stand Sit to Stand: Min assist            General transfer comment: min assist to control power up and get LLE under her    Ambulation/Gait Ambulation/Gait assistance: Min guard;Min assist Gait Distance (Feet): 60 Feet Assistive device: Rolling walker (2 wheels) Gait Pattern/deviations: Step-to pattern;Step-through pattern;Decreased stride length;Decreased weight shift to left Gait velocity: reduced Gait velocity interpretation: <1.31 ft/sec, indicative of household ambulator General Gait Details: sidesteps to prepare for gait   Stairs             Wheelchair Mobility    Modified Rankin (Stroke Patients Only)       Balance Overall balance assessment: Needs assistance Sitting-balance support: Bilateral upper extremity supported;Feet supported Sitting balance-Leahy Scale: Good     Standing balance support: Bilateral upper extremity supported;During functional activity Standing balance-Leahy Scale: Poor                              Cognition Arousal/Alertness: Awake/alert Behavior During Therapy: WFL for tasks assessed/performed                                   General Comments: agreed the ice is helpful        Exercises Total Joint Exercises Ankle Circles/Pumps: AROM;5 reps Quad Sets: AROM;5 reps Gluteal Sets: AROM;5 reps    General Comments General comments (skin integrity, edema, etc.): pt was assisted to get to side of bed, then to balance and then to walk in room mult trips for total of 60'.  Pt is  walking with obvious discomfort on LLE but is now able to maneuver with no help including to sit down by bringing LLE out first      Pertinent Vitals/Pain Pain Assessment: Faces Faces Pain Scale: Hurts little more Pain Location: L knee Pain Descriptors / Indicators: Guarding Pain Intervention(s): Limited activity within patient's tolerance;Monitored during session;Premedicated before session;Repositioned;Ice applied    Home Living                           Prior Function            PT Goals (current goals can now be found in the care plan section) Acute Rehab PT Goals Patient Stated Goal: to go to rehab Progress towards PT goals: Progressing toward goals    Frequency    7X/week      PT Plan Current plan remains appropriate    Co-evaluation              AM-PAC PT "6 Clicks" Mobility   Outcome Measure  Help needed turning from your back to your side while in a flat bed without using bedrails?: A Little Help needed moving from lying on your back to sitting on the side of a flat bed without using bedrails?: A Little Help needed moving to and from a bed to a chair (including a wheelchair)?: A Little Help needed standing up from a chair using your arms (e.g., wheelchair or bedside chair)?: A Little Help needed to walk in hospital room?: A Little Help needed climbing 3-5 steps with a railing? : Total 6 Click Score: 16    End of Session Equipment Utilized During Treatment: Left knee immobilizer;Gait belt Activity Tolerance: Patient limited by fatigue;Patient limited by pain Patient left: with call bell/phone within reach;in bed;with bed alarm set Nurse Communication: Mobility status PT Visit Diagnosis: Other abnormalities of gait and mobility (R26.89);Pain;Difficulty in walking, not elsewhere classified (R26.2) Pain - Right/Left: Left Pain - part of body: Knee     Time: 2094-7096 PT Time Calculation (min) (ACUTE ONLY): 27 min  Charges:  $Gait Training: 8-22 mins $Therapeutic Exercise: 8-22 mins           Ivar Drape 12/07/2020, 4:22 PM  Samul Dada, PT PhD Acute Rehab Dept. Number: [facility] [id number] and [facility] [phone]

## 2020-12-07 NOTE — Progress Notes (Signed)
Mobility Specialist Progress Note    12/07/20 1500  Mobility  Activity Ambulated in hall  Level of Assistance Minimal assist, patient does 75% or more (+2 for IV management)  Assistive Device Front wheel walker  LLE Weight Bearing WBAT  Distance Ambulated (ft) 170 ft  Mobility Ambulated with assistance in hallway  Mobility Response Tolerated well  Mobility performed by Mobility specialist (PT)  Bed Position Chair  $Mobility charge 1 Mobility   Received pt in bed having no complaints and agreeable to PT & Mobility session. Bed mobility has improved, pt more independent w/ movement minG. During ambulation pt needed reoccurring VC on body placement in reference to RW. Ambulation cut short d/t fatigue and visible perspiration. Returned back to chair w/ call bell in lap and family in room.  Referred to PT's note for in-dept analysis of session.    Frederico Hamman Mobility Specialist Phone Number [phone]

## 2020-12-07 NOTE — Discharge Summary (Signed)
Patient ID: Bonnie [name] MRN: [id number] DOB/AGE: [birth year] 78 y.o.  Admit date: 12/05/2020 Discharge date: 12/07/2020  Admission Diagnoses:  Principal Problem:   Unilateral primary osteoarthritis, left knee Active Problems:   DJD (degenerative joint disease) of knee   Status post left knee replacement   Discharge Diagnoses:  Same  Past Medical History:  Diagnosis Date   Anxiety    Arthritis    Hypothyroidism    Neutropenia (HCC)    had this in the past, resolve in 2014   Sleep apnea    "mild"-does not use cpap    Surgeries: Procedure(s): LEFT TOTAL KNEE ARTHROPLASTY on 12/05/2020   Consultants:   Discharged Condition: Improved  Hospital Course: Bonnie [name] is an 80 y.o. female who was admitted 12/05/2020 for operative treatment ofUnilateral primary osteoarthritis, left knee. Patient has severe unremitting pain that affects sleep, daily activities, and work/hobbies. After pre-op clearance the patient was taken to the operating room on 12/05/2020 and underwent  Procedure(s): LEFT TOTAL KNEE ARTHROPLASTY.    Patient was given perioperative antibiotics:  Anti-infectives (From admission, onward)    Start     Dose/Rate Route Frequency Ordered Stop   12/05/20 1800  ceFAZolin (ANCEF) IVPB 1 g/50 mL premix        1 g 100 mL/hr over 30 Minutes Intravenous Every 6 hours 12/05/20 1525 12/06/20 0126   12/05/20 1000  ceFAZolin (ANCEF) IVPB 2g/100 mL premix        2 g 200 mL/hr over 30 Minutes Intravenous On call to O.R. 12/05/20 0957 12/05/20 1250   12/05/20 1000  ceFAZolin (ANCEF) 2-4 GM/100ML-% IVPB       Note to Pharmacy: Irven [place]   : cabinet override      12/05/20 1000 12/05/20 1232        Patient was given sequential compression devices, early ambulation, and chemoprophylaxis to prevent DVT.  Patient benefited maximally from hospital stay and there were no complications.    Recent vital signs: Patient Vitals for the past 24 hrs:  BP Temp Temp src Pulse Resp  SpO2  12/07/20 0800 122/63 99.3 F (37.4 C) Oral 81 19 90 %  12/07/20 0514 139/70 99 F (37.2 C) Oral 88 16 98 %  12/07/20 0030 124/65 98.8 F (37.1 C) Oral 94 18 100 %  12/06/20 2245 115/76 98.6 F (37 C) Oral 94 16 98 %  12/06/20 2028 133/73 (!) 102.7 F (39.3 C) Oral 97 17 92 %  12/06/20 1443 114/63 99.4 F (37.4 C) Oral 79 20 100 %     Recent laboratory studies:  Recent Labs    12/06/20 0222  WBC 9.5  HGB 12.1  HCT 37.5  PLT 165  NA 136  K 4.2  CL 104  CO2 25  BUN 12  CREATININE 0.91  GLUCOSE 153*  CALCIUM 8.6*     Discharge Medications:   Allergies as of 12/07/2020       Reactions   Metoclopramide Rash        Medication List     TAKE these medications    aspirin 81 MG chewable tablet Chew 1 tablet (81 mg total) by mouth 2 (two) times daily.   levothyroxine 150 MCG tablet Commonly known as: SYNTHROID Take 150 mcg by mouth daily before breakfast.   methocarbamol 500 MG tablet Commonly known as: ROBAXIN Take 1 tablet (500 mg total) by mouth every 6 (six) hours as needed for muscle spasms.   omeprazole 20 MG capsule Commonly known as: PRILOSEC Take  40 mg by mouth daily.   Osteo Bi-Flex Triple Strength Tabs Take 2 tablets by mouth daily.   oxyCODONE 5 MG immediate release tablet Commonly known as: Oxy IR/ROXICODONE Take 1-2 tablets (5-10 mg total) by mouth every 4 (four) hours as needed for moderate pain (pain score 4-6).   torsemide 20 MG tablet Commonly known as: DEMADEX Take 10-20 mg by mouth daily as needed (swelling).               Durable Medical Equipment  (From admission, onward)           Start     Ordered   12/05/20 1526  DME 3 n 1  Once        12/05/20 1525   12/05/20 1526  DME Walker rolling  Once       Question Answer Comment  Walker: With 5 Inch Wheels   Patient needs a walker to treat with the following condition Status post total left knee replacement      12/05/20 1525            Diagnostic  Studies: DG Knee Left Port  Result Date: 12/05/2020 CLINICAL DATA:  Status post left total knee replacement. EXAM: PORTABLE LEFT KNEE - 1-2 VIEW COMPARISON:  October 24, 2020. FINDINGS: The left femoral and tibial components are well situated. The patellar component is also well situated. Expected postoperative changes are noted in the soft tissues anteriorly. IMPRESSION: Status post left total knee arthroplasty. Electronically Signed   By: Lupita Raider M.D.   On: 12/05/2020 14:37    Disposition: Discharge disposition: 03-Skilled Nursing Facility          Follow-up Information     Kathryne Hitch, MD Follow up in 2 week(s).   Specialty: Orthopedic Surgery Contact information: [address] [place] [address] [phone]                  Signed: Kathryne Hitch 12/07/2020, 9:42 AM

## 2020-12-07 NOTE — TOC Progression Note (Addendum)
Transition of Care [facility]) - Initial/Assessment Note    Patient Details  Name: Bonnie [name] MRN: [id number] Date of Birth: May 29, 1940  Transition of [facility]) CM/SW Contact:    Milinda Antis, [place] Phone Number: 12/07/2020, 11:33 AM  Clinical Narrative:                 CSW spoke with Bonnie [name], admissions director at [facility].  The facility can offer a bed to the patient, but cannot accept the patient until she has had a 3 midnight inpatient stay.    CSW met with patient at bedside and confirmed that Bonnie [name] is the facility of choice.    Attending notified.   TOC barrier to d/c- 3 midnight stay  Expected Discharge Plan: [place] Barriers to Discharge: Continued Medical Work up   Patient Goals and CMS Choice   CMS Medicare.gov Compare Post Acute Care list provided to:: Patient    Expected Discharge Plan and Services Expected Discharge Plan: [place]   Discharge Planning Services: CM Consult   Living arrangements for the past 2 months: Single Family Home Expected Discharge Date: 12/07/20                                    Prior Living Arrangements/Services Living arrangements for the past 2 months: Single Family Home Lives with:: Self Patient language and need for interpreter reviewed:: Yes Do you feel safe going back to the place where you live?: Yes      Need for Family Participation in Patient Care: Yes (Comment) Care giver support system in place?: Yes (comment)   Criminal Activity/Legal Involvement Pertinent to Current Situation/Hospitalization: No - Comment as needed  Activities of Daily Living Home Assistive Devices/Equipment: Eyeglasses, Blood pressure cuff ADL Screening (condition at time of admission) Patient's cognitive ability adequate to safely complete daily activities?: Yes Is the patient deaf or have difficulty hearing?: No Does the patient have difficulty seeing, even when wearing glasses/contacts?: No Does  the patient have difficulty concentrating, remembering, or making decisions?: No Patient able to express need for assistance with ADLs?: Yes Does the patient have difficulty dressing or bathing?: No Independently performs ADLs?: Yes (appropriate for developmental age) Does the patient have difficulty walking or climbing stairs?: Yes Weakness of Legs: Left Weakness of Arms/Hands: None  Permission Sought/Granted   Permission granted to share information with : Yes, Verbal Permission Granted  Share Information with NAME: Bonnie [name] (Daughter) [phone]           Emotional Assessment Appearance:: Appears stated age Attitude/Demeanor/Rapport: Gracious Affect (typically observed): Accepting Orientation: : Oriented to Self, Oriented to Place, Oriented to  Time, Oriented to Situation Alcohol / Substance Use: Not Applicable Psych Involvement: No (comment)  Admission diagnosis:  DJD (degenerative joint disease) of knee [M17.9] Status post left knee replacement [Z96.652] Patient Active Problem List   Diagnosis Date Noted   DJD (degenerative joint disease) of knee 12/05/2020   Status post left knee replacement 12/05/2020   Unilateral primary osteoarthritis, left knee 10/24/2020   Status post total knee replacement, right 09/30/2017   Unilateral primary osteoarthritis, right knee 08/19/2017   PCP:  Ardith Dark, PA-C Pharmacy:   [id number] [place], [place] - [id number] [id number]: [phone]: [phone]    Social Determinants of Health (SDOH) Interventions    Readmission Risk Interventions No flowsheet data found.

## 2020-12-07 NOTE — Progress Notes (Signed)
Physical Therapy Treatment Patient Details Name: Bonnie [name] MRN: [id number] DOB: 10-11-1940 Today's Date: 12/07/2020   History of Present Illness Pt adm 11/1 for lt TKR. PMH - rt TKR, arthritis    PT Comments    Pt was seen for mobility on the hallway with assistance to walk the longer trip and progress with close chair guard and second person.  Pt is walking in [place] with help to balance, and noted her control of LLE includes some circumduction and flexion of posture.  Pt is using postural alterations to help with LLE pain, control of balance and safety with walker advancement.  Pt reminded to keep walker a reasonable distance close to avoid overbalancing and reducing the abiltiy to control WB on LLE.  Painful L knee elevated and ice applied to it at the end.  Follow along with her for progression of distances as her stay permits, and is expected to go to SNF tomorrow.   Recommendations for follow up therapy are one component of a multi-disciplinary discharge planning process, led by the attending physician.  Recommendations may be updated based on patient status, additional functional criteria and insurance authorization.  Follow Up Recommendations  Follow physician's recommendations for discharge plan and follow up therapies     Assistance Recommended at Discharge Frequent or constant Supervision/Assistance  Equipment Recommendations  None recommended by PT    Recommendations for Other Services       Precautions / Restrictions Precautions Precautions: Fall;Knee Precaution Comments: reviewed precautions including brace management Required Braces or Orthoses: Knee Immobilizer - Left Knee Immobilizer - Left: On except when in CPM Restrictions Weight Bearing Restrictions: Yes LLE Weight Bearing: Weight bearing as tolerated     Mobility  Bed Mobility Overal bed mobility: Needs Assistance Bed Mobility: Supine to Sit     Supine to sit: Min assist;HOB elevated     General bed  mobility comments: min assist to support her LLE briefly off the bed    Transfers Overall transfer level: Needs assistance Equipment used: Rolling walker (2 wheels) Transfers: Sit to/from Stand Sit to Stand: Min assist           General transfer comment: min assist to control power up and get LLE under her    Ambulation/Gait Ambulation/Gait assistance: Min guard;Min assist Gait Distance (Feet): 170 Feet Assistive device: Rolling walker (2 wheels) Gait Pattern/deviations: Step-to pattern;Step-through pattern;Decreased stride length;Decreased weight shift to left Gait velocity: reduced Gait velocity interpretation: <1.31 ft/sec, indicative of household ambulator General Gait Details: sidesteps to prepare for gait   Stairs             Wheelchair Mobility    Modified Rankin (Stroke Patients Only)       Balance Overall balance assessment: Needs assistance Sitting-balance support: Bilateral upper extremity supported;Feet supported Sitting balance-Leahy Scale: Good     Standing balance support: Bilateral upper extremity supported;During functional activity Standing balance-Leahy Scale: Poor                              Cognition Arousal/Alertness: Awake/alert Behavior During Therapy: WFL for tasks assessed/performed                                   General Comments: agreed the ice is helpful        Exercises Total Joint Exercises Ankle Circles/Pumps: AROM;5 reps Quad Sets: AROM;5 reps Gluteal Sets:  AROM;5 reps    General Comments General comments (skin integrity, edema, etc.): instructed pt on correct distance to have walker from her to maximize her pain control and balance with gait      Pertinent Vitals/Pain Pain Assessment: Faces Faces Pain Scale: Hurts little more Pain Location: L knee Pain Descriptors / Indicators: Guarding Pain Intervention(s): Monitored during session;Repositioned;Ice applied    Home Living                           Prior Function            PT Goals (current goals can now be found in the care plan section) Acute Rehab PT Goals Patient Stated Goal: to go to rehab Progress towards PT goals: Progressing toward goals    Frequency    7X/week      PT Plan Current plan remains appropriate    Co-evaluation              AM-PAC PT "6 Clicks" Mobility   Outcome Measure  Help needed turning from your back to your side while in a flat bed without using bedrails?: A Little Help needed moving from lying on your back to sitting on the side of a flat bed without using bedrails?: A Little Help needed moving to and from a bed to a chair (including a wheelchair)?: A Little Help needed standing up from a chair using your arms (e.g., wheelchair or bedside chair)?: A Little Help needed to walk in hospital room?: A Little Help needed climbing 3-5 steps with a railing? : Total 6 Click Score: 16    End of Session Equipment Utilized During Treatment: Left knee immobilizer;Gait belt Activity Tolerance: Patient limited by fatigue;Patient limited by pain Patient left: with call bell/phone within reach;in chair;with chair alarm set Nurse Communication: Mobility status PT Visit Diagnosis: Other abnormalities of gait and mobility (R26.89);Pain;Difficulty in walking, not elsewhere classified (R26.2) Pain - Right/Left: Left Pain - part of body: Knee     Time: 0865-7846 PT Time Calculation (min) (ACUTE ONLY): 47 min  Charges:  $Gait Training: 8-22 mins $Therapeutic Exercise: 8-22 mins $Therapeutic Activity: 23-37 mins                Ivar Drape 12/07/2020, 4:30 PM  Samul Dada, PT PhD Acute Rehab Dept. Number: [facility] [id number] and [facility] [phone]

## 2020-12-07 NOTE — Plan of Care (Signed)
Pt is A&O x 4. Patient continues to complain of pain but does not complain of any other ailments at this time. Pt was encouraged to use her incentive spirometer while awake to help open up her lungs and reduce her fever. Ice was reapplied to her knee, and the Dr. visit with the pt to change her dressing in preparation for d/c to SNF.   Problem: Education: Goal: Knowledge of General Education information will improve Description: Including pain rating scale, medication(s)/side effects and non-pharmacologic comfort measures Outcome: Progressing   Problem: Health Behavior/Discharge Planning: Goal: Ability to manage health-related needs will improve Outcome: Progressing   Problem: Clinical Measurements: Goal: Ability to maintain clinical measurements within normal limits will improve Outcome: Progressing Goal: Will remain free from infection Outcome: Progressing Goal: Diagnostic test results will improve Outcome: Progressing Goal: Respiratory complications will improve Outcome: Progressing Goal: Cardiovascular complication will be avoided Outcome: Progressing   Problem: Activity: Goal: Risk for activity intolerance will decrease Outcome: Progressing   Problem: Nutrition: Goal: Adequate nutrition will be maintained Outcome: Progressing   Problem: Coping: Goal: Level of anxiety will decrease Outcome: Progressing   Problem: Elimination: Goal: Will not experience complications related to bowel motility Outcome: Progressing Goal: Will not experience complications related to urinary retention Outcome: Progressing   Problem: Pain Managment: Goal: General experience of comfort will improve Outcome: Progressing   Problem: Safety: Goal: Ability to remain free from injury will improve Outcome: Progressing   Problem: Skin Integrity: Goal: Risk for impaired skin integrity will decrease Outcome: Progressing

## 2020-12-07 NOTE — Progress Notes (Signed)
Subjective: 2 Days Post-Op Procedure(s) (LRB): LEFT TOTAL KNEE ARTHROPLASTY (Left) Patient reports pain as moderate.    Objective: Vital signs in last 24 hours: Temp:  [98.6 F (37 C)-102.7 F (39.3 C)] 99.3 F (37.4 C) (11/03 0800) Pulse Rate:  [79-97] 81 (11/03 0800) Resp:  [16-20] 19 (11/03 0800) BP: (114-139)/(63-76) 122/63 (11/03 0800) SpO2:  [90 %-100 %] 90 % (11/03 0800)  Intake/Output from previous day: 11/02 0701 - 11/03 0700 In: -  Out: 1 [Urine:1] Intake/Output this shift: No intake/output data recorded.  Recent Labs    12/06/20 0222  HGB 12.1   Recent Labs    12/06/20 0222  WBC 9.5  RBC 4.19  HCT 37.5  PLT 165   Recent Labs    12/06/20 0222  NA 136  K 4.2  CL 104  CO2 25  BUN 12  CREATININE 0.91  GLUCOSE 153*  CALCIUM 8.6*   No results for input(s): LABPT, INR in the last 72 hours.  Sensation intact distally Intact pulses distally Dorsiflexion/Plantar flexion intact Incision: dressing C/D/I No cellulitis present Compartment soft   Assessment/Plan: 2 Days Post-Op Procedure(s) (LRB): LEFT TOTAL KNEE ARTHROPLASTY (Left) Up with therapy Discharge to SNF      Bonnie [name] 12/07/2020, 9:40 AM

## 2020-12-08 DIAGNOSIS — M1712 Unilateral primary osteoarthritis, left knee: Secondary | ICD-10-CM | POA: Diagnosis not present

## 2020-12-08 NOTE — Progress Notes (Signed)
Pt is A&O x4. Left knee incision with Aquacel dressing dry and intact. Pain is controlled with oral narcotics. Pt's daughter came in, discharge papers and instructions was given to the daughter. Pt is discharged to SNF, [facility] [place] transported by daughter via Sales [facility]. Report was given to [place], Charity fundraiser at [employer].

## 2020-12-08 NOTE — Discharge Summary (Signed)
Patient ID: Bonnie [name] MRN: [id number] DOB/AGE: December 31, 1940 80 y.o.  Admit date: 12/05/2020 Discharge date: 12/08/2020  Admission Diagnoses:  Principal Problem:   Unilateral primary osteoarthritis, left knee Active Problems:   DJD (degenerative joint disease) of knee   Status post left knee replacement   Discharge Diagnoses:  Same  Past Medical History:  Diagnosis Date   Anxiety    Arthritis    Hypothyroidism    Neutropenia (HCC)    had this in the past, resolve in 2014   Sleep apnea    "mild"-does not use cpap    Surgeries: Procedure(s): LEFT TOTAL KNEE ARTHROPLASTY on 12/05/2020   Consultants:   Discharged Condition: Improved  Hospital Course: Bonnie [name] is an 80 y.o. female who was admitted 12/05/2020 for operative treatment ofUnilateral primary osteoarthritis, left knee. Patient has severe unremitting pain that affects sleep, daily activities, and work/hobbies. After pre-op clearance the patient was taken to the operating room on 12/05/2020 and underwent  Procedure(s): LEFT TOTAL KNEE ARTHROPLASTY.    Patient was given perioperative antibiotics:  Anti-infectives (From admission, onward)    Start     Dose/Rate Route Frequency Ordered Stop   12/05/20 1800  ceFAZolin (ANCEF) IVPB 1 g/50 mL premix        1 g 100 mL/hr over 30 Minutes Intravenous Every 6 hours 12/05/20 1525 12/06/20 0126   12/05/20 1000  ceFAZolin (ANCEF) IVPB 2g/100 mL premix        2 g 200 mL/hr over 30 Minutes Intravenous On call to O.R. 12/05/20 0957 12/05/20 1250   12/05/20 1000  ceFAZolin (ANCEF) 2-4 GM/100ML-% IVPB       Note to Pharmacy: Irven [place]   : cabinet override      12/05/20 1000 12/05/20 1232        Patient was given sequential compression devices, early ambulation, and chemoprophylaxis to prevent DVT.  Patient benefited maximally from hospital stay and there were no complications.    Recent vital signs: Patient Vitals for the past 24 hrs:  BP Temp Temp src Pulse Resp  SpO2  12/07/20 1955 121/66 98.4 F (36.9 C) Oral 81 16 96 %  12/07/20 1114 126/85 99.1 F (37.3 C) Oral 85 16 97 %  12/07/20 0800 122/63 99.3 F (37.4 C) Oral 81 19 90 %     Recent laboratory studies:  Recent Labs    12/06/20 0222  WBC 9.5  HGB 12.1  HCT 37.5  PLT 165  NA 136  K 4.2  CL 104  CO2 25  BUN 12  CREATININE 0.91  GLUCOSE 153*  CALCIUM 8.6*     Discharge Medications:   Allergies as of 12/08/2020       Reactions   Metoclopramide Rash        Medication List     TAKE these medications    aspirin 81 MG chewable tablet Chew 1 tablet (81 mg total) by mouth 2 (two) times daily.   levothyroxine 150 MCG tablet Commonly known as: SYNTHROID Take 150 mcg by mouth daily before breakfast.   methocarbamol 500 MG tablet Commonly known as: ROBAXIN Take 1 tablet (500 mg total) by mouth every 6 (six) hours as needed for muscle spasms.   omeprazole 20 MG capsule Commonly known as: PRILOSEC Take 40 mg by mouth daily.   Osteo Bi-Flex Triple Strength Tabs Take 2 tablets by mouth daily.   oxyCODONE 5 MG immediate release tablet Commonly known as: Oxy IR/ROXICODONE Take 1-2 tablets (5-10 mg total) by mouth every  4 (four) hours as needed for moderate pain (pain score 4-6).   torsemide 20 MG tablet Commonly known as: DEMADEX Take 10-20 mg by mouth daily as needed (swelling).               Durable Medical Equipment  (From admission, onward)           Start     Ordered   12/05/20 1526  DME 3 n 1  Once        12/05/20 1525   12/05/20 1526  DME Walker rolling  Once       Question Answer Comment  Walker: With 5 Inch Wheels   Patient needs a walker to treat with the following condition Status post total left knee replacement      12/05/20 1525            Diagnostic Studies: DG Knee Left Port  Result Date: 12/05/2020 CLINICAL DATA:  Status post left total knee replacement. EXAM: PORTABLE LEFT KNEE - 1-2 VIEW COMPARISON:  October 24, 2020.  FINDINGS: The left femoral and tibial components are well situated. The patellar component is also well situated. Expected postoperative changes are noted in the soft tissues anteriorly. IMPRESSION: Status post left total knee arthroplasty. Electronically Signed   By: Lupita Raider M.D.   On: 12/05/2020 14:37    Disposition: Discharge disposition: 03-Skilled Nursing Facility          Follow-up Information     Kathryne Hitch, MD Follow up in 2 week(s).   Specialty: Orthopedic Surgery Contact information: [address] [place] [address] [phone]                  Signed: Kathryne Hitch 12/08/2020, 6:42 AM

## 2020-12-08 NOTE — TOC Transition Note (Signed)
Transition of Care [facility]) - CM/SW Discharge Note   Patient Details  Name: Bonnie [name] MRN: [id number] Date of Birth: 11-Jul-1940  Transition of Care [facility]) CM/SW Contact:  Ralene Bathe, LCSWA Phone Number: 12/08/2020, 10:12 AM   Clinical Narrative:     Patient [name] DC to: SNF Anticipated DC date: 12/08/2020 Family notified:  Yes Transport by: Family   Per MD patient ready for DC to SNF. RN to call report prior to discharge [phone]. RN, patient, patient's family, and facility notified of DC. Discharge Summary and FL2 sent to facility. DC packet on chart. The patient [name] be transported to the facility by family.  The facility agreed.  CSW [name] sign off for now as social work intervention is no longer needed. Please consult [place] again if new needs arise.      Barriers to Discharge: Continued Medical Work up   Patient Goals and CMS Choice   CMS Medicare.gov Compare Post Acute Care list provided to:: Patient    Discharge Placement                       Discharge Plan and Services   Discharge Planning Services: CM Consult                                 Social Determinants of Health (SDOH) Interventions     Readmission Risk Interventions No flowsheet data found.

## 2020-12-08 NOTE — Progress Notes (Signed)
Patient ID: Bonnie [name], female   DOB: 23-Jan-1941, 80 y.o.   MRN: [id number] No acute changes.  Discharge to SNF today.

## 2020-12-13 ENCOUNTER — Telehealth: Payer: Self-pay

## 2020-12-13 NOTE — Telephone Encounter (Signed)
Bonnie [name] with PT called and needs orders faxed over on use of the immobilizer. Ie how long she needs to wear it/when can she discontinue it. Please advise   Cb# [phone] Fax# [phone]

## 2020-12-13 NOTE — Telephone Encounter (Signed)
Letter completed and faxed to them

## 2020-12-20 ENCOUNTER — Encounter: Payer: Self-pay | Admitting: Orthopaedic Surgery

## 2020-12-20 ENCOUNTER — Other Ambulatory Visit: Payer: Self-pay

## 2020-12-20 ENCOUNTER — Ambulatory Visit (INDEPENDENT_AMBULATORY_CARE_PROVIDER_SITE_OTHER): Payer: Medicare Other | Admitting: Orthopaedic Surgery

## 2020-12-20 DIAGNOSIS — Z96652 Presence of left artificial knee joint: Secondary | ICD-10-CM

## 2020-12-20 NOTE — Progress Notes (Signed)
The patient comes in today 2 weeks status post a left total knee arthroplasty.  She is 80 years old and staying in skilled nursing facility.  She is scheduled to be released from there to home tomorrow and start home health therapy.  She reports good progress with her knee.  She is ambulating with a walker.  3 years ago she had a right successful knee replacement.  She is highly motivated.  Her left knee incision looks good.  Remove the staples in place Steri-Strips.  Her extension is full and her flexion is to about 80 to 85 degrees.  Her calf is soft.  She can stop her baby aspirin.  She will continue her TED hose until she has no foot or ankle swelling.  When she does run low on pain medication she will let us know.  Also, when home therapy is done with her and we need to set her up for outpatient physical therapy in [place] she will let us know.  We see her back in 4 weeks no x-rays are needed.

## 2020-12-26 ENCOUNTER — Other Ambulatory Visit: Payer: Self-pay | Admitting: Orthopaedic Surgery

## 2020-12-26 ENCOUNTER — Telehealth: Payer: Self-pay | Admitting: Orthopaedic Surgery

## 2020-12-26 MED ORDER — OXYCODONE HCL 5 MG PO TABS: 5.0000 mg | ORAL_TABLET | Freq: Four times a day (QID) | ORAL | 0 refills | Status: DC | PRN

## 2020-12-26 MED ORDER — OXYCODONE HCL 5 MG PO TABS: 5.0000 mg | ORAL_TABLET | Freq: Four times a day (QID) | ORAL | 0 refills | Status: AC | PRN

## 2020-12-26 NOTE — Telephone Encounter (Signed)
Called and informed pt.  

## 2020-12-26 NOTE — Telephone Encounter (Signed)
Please advise on pain medication. 

## 2020-12-26 NOTE — Telephone Encounter (Signed)
Pt asking for P.T orders to be sent to [facility]. Their fax number is [phone] and phone number is [phone]. Pt also wanted to ask about pain medication refill and if Dr. Magnus Ivan wanted to keep her on her current prescription or change it. Either way pt would like to request a refill. The best pharmacy is [id number] - [place], [place] - [address] and the best call back number if needed is [phone].

## 2020-12-26 NOTE — Telephone Encounter (Signed)
Therapy ordered faxed

## 2021-01-17 ENCOUNTER — Other Ambulatory Visit: Payer: Self-pay

## 2021-01-17 ENCOUNTER — Encounter: Payer: Self-pay | Admitting: Orthopaedic Surgery

## 2021-01-17 ENCOUNTER — Ambulatory Visit (INDEPENDENT_AMBULATORY_CARE_PROVIDER_SITE_OTHER): Payer: Medicare Other | Admitting: Orthopaedic Surgery

## 2021-01-17 DIAGNOSIS — Z96652 Presence of left artificial knee joint: Secondary | ICD-10-CM

## 2021-01-17 NOTE — Progress Notes (Signed)
The patient is a 80 year old female who is now 6 weeks status post a left total knee replacement.  She is very active person and is doing well overall.  She is not taking pain medications.  She says the knee is swollen but she has good range of motion and strength.  On exam her extension is full of her left knee and her flexion is almost full.  The knee feels ligamentously stable and she does look good overall.  She will continue to increase her activities as comfort allows.  We will see her back in 3 months to see how she is doing overall and I would like an AP and lateral of her left operative knee at that visit.

## 2021-02-12 ENCOUNTER — Telehealth: Payer: Self-pay | Admitting: Orthopaedic Surgery

## 2021-02-12 NOTE — Telephone Encounter (Signed)
Pt called and states she had fallen Saturday and her knee is a little sore. Pt had surgery on knee 12/05/2020. Wondering if she needs to be seen?  CB [phone]

## 2021-02-12 NOTE — Telephone Encounter (Signed)
I told patient that I think the soreness is normal If she has increased pain or can't near weight she needs to call back

## 2021-04-25 ENCOUNTER — Ambulatory Visit (INDEPENDENT_AMBULATORY_CARE_PROVIDER_SITE_OTHER): Payer: Medicare Other | Admitting: Orthopaedic Surgery

## 2021-04-25 ENCOUNTER — Ambulatory Visit (INDEPENDENT_AMBULATORY_CARE_PROVIDER_SITE_OTHER): Payer: Medicare Other

## 2021-04-25 ENCOUNTER — Encounter: Payer: Self-pay | Admitting: Orthopaedic Surgery

## 2021-04-25 DIAGNOSIS — Z96652 Presence of left artificial knee joint: Secondary | ICD-10-CM

## 2021-04-25 NOTE — Progress Notes (Signed)
The patient is getting close to 6 months out from a left press-fit total knee arthroplasty.  She is an active 81 year old female.  We actually replaced her right knee in 2019.  She says she is doing well overall.  She has no pain.  She did fall down the stairs in early January but said her knee is doing well.  She takes no medications for pain.  She does not walk with any assistive device and is very satisfied. ? ?Examination of both knees show they are ligamentously stable with excellent range of motion.  X-rays today of the left more recent operative knee shows a well-seated total knee arthroplasty with no complicating features.  The AP view also shows the right knee.  These are standing films.  They both look good. ? ?At this point follow-up for her knees can be as needed.  She understands that if she has any issues at all do not hesitate to let us know.  All questions and concerns were answered and addressed. ?
